# Patient Record
Sex: Male | Born: 1945 | Race: White | Hispanic: No | Marital: Married
Health system: Midwestern US, Academic
[De-identification: ages and names within clinical notes are randomized; demographics above are authoritative.]

---

## 2016-07-30 MED ORDER — ATORVASTATIN 40 MG PO TAB
40 mg | ORAL_TABLET | Freq: Every day | ORAL | 1 refills | Status: DC
Start: 2016-07-30 — End: 2017-02-04

## 2016-08-27 ENCOUNTER — Encounter: Admit: 2016-08-27 | Discharge: 2016-08-27 | Payer: MEDICARE

## 2016-09-12 ENCOUNTER — Ambulatory Visit: Admit: 2016-09-12 | Discharge: 2016-09-12 | Payer: MEDICARE

## 2016-09-12 ENCOUNTER — Encounter: Admit: 2016-09-12 | Discharge: 2016-09-12 | Payer: MEDICARE

## 2016-09-12 DIAGNOSIS — M47816 Spondylosis without myelopathy or radiculopathy, lumbar region: ICD-10-CM

## 2016-09-12 DIAGNOSIS — M5430 Sciatica, unspecified side: ICD-10-CM

## 2016-09-12 DIAGNOSIS — Z9889 Other specified postprocedural states: ICD-10-CM

## 2016-09-12 DIAGNOSIS — M4696 Unspecified inflammatory spondylopathy, lumbar region: Principal | ICD-10-CM

## 2016-09-12 DIAGNOSIS — I4891 Unspecified atrial fibrillation: ICD-10-CM

## 2016-09-12 DIAGNOSIS — F419 Anxiety disorder, unspecified: ICD-10-CM

## 2016-09-12 DIAGNOSIS — E785 Hyperlipidemia, unspecified: ICD-10-CM

## 2016-09-12 DIAGNOSIS — E119 Type 2 diabetes mellitus without complications: ICD-10-CM

## 2016-09-12 DIAGNOSIS — E669 Obesity, unspecified: ICD-10-CM

## 2016-09-12 DIAGNOSIS — R079 Chest pain, unspecified: ICD-10-CM

## 2016-09-12 DIAGNOSIS — I10 Essential (primary) hypertension: ICD-10-CM

## 2016-09-12 DIAGNOSIS — I4892 Unspecified atrial flutter: ICD-10-CM

## 2016-09-12 DIAGNOSIS — F329 Major depressive disorder, single episode, unspecified: ICD-10-CM

## 2016-10-03 ENCOUNTER — Encounter: Admit: 2016-10-03 | Discharge: 2016-10-03 | Payer: MEDICARE

## 2016-10-03 ENCOUNTER — Ambulatory Visit: Admit: 2016-10-03 | Discharge: 2016-10-04 | Payer: MEDICARE

## 2016-10-03 DIAGNOSIS — R079 Chest pain, unspecified: ICD-10-CM

## 2016-10-03 DIAGNOSIS — F419 Anxiety disorder, unspecified: ICD-10-CM

## 2016-10-03 DIAGNOSIS — E785 Hyperlipidemia, unspecified: ICD-10-CM

## 2016-10-03 DIAGNOSIS — F329 Major depressive disorder, single episode, unspecified: ICD-10-CM

## 2016-10-03 DIAGNOSIS — E669 Obesity, unspecified: ICD-10-CM

## 2016-10-03 DIAGNOSIS — E119 Type 2 diabetes mellitus without complications: ICD-10-CM

## 2016-10-03 DIAGNOSIS — I10 Essential (primary) hypertension: ICD-10-CM

## 2016-10-03 DIAGNOSIS — I4891 Unspecified atrial fibrillation: ICD-10-CM

## 2016-10-03 DIAGNOSIS — Z9889 Other specified postprocedural states: ICD-10-CM

## 2016-10-03 DIAGNOSIS — I4892 Unspecified atrial flutter: ICD-10-CM

## 2016-10-03 MED ORDER — LIDOCAINE 5 % TP PTMD
1 | MEDICATED_PATCH | TOPICAL | 1 refills | 30.00000 days | Status: AC
Start: 2016-10-03 — End: 2017-02-13

## 2016-10-04 DIAGNOSIS — M4696 Unspecified inflammatory spondylopathy, lumbar region: Principal | ICD-10-CM

## 2016-11-19 ENCOUNTER — Encounter: Admit: 2016-11-19 | Discharge: 2016-11-19 | Payer: MEDICARE

## 2016-11-19 MED ORDER — HYDROCHLOROTHIAZIDE 25 MG PO TAB
25 mg | ORAL_TABLET | Freq: Every morning | ORAL | 3 refills | 28.00000 days | Status: AC
Start: 2016-11-19 — End: 2018-03-10

## 2016-11-28 ENCOUNTER — Encounter: Admit: 2016-11-28 | Discharge: 2016-11-28 | Payer: MEDICARE

## 2016-11-28 ENCOUNTER — Ambulatory Visit: Admit: 2016-11-28 | Discharge: 2016-11-28 | Payer: MEDICARE

## 2016-11-28 DIAGNOSIS — I4891 Unspecified atrial fibrillation: ICD-10-CM

## 2016-11-28 DIAGNOSIS — E669 Obesity, unspecified: ICD-10-CM

## 2016-11-28 DIAGNOSIS — R079 Chest pain, unspecified: ICD-10-CM

## 2016-11-28 DIAGNOSIS — E785 Hyperlipidemia, unspecified: ICD-10-CM

## 2016-11-28 DIAGNOSIS — I4892 Unspecified atrial flutter: ICD-10-CM

## 2016-11-28 DIAGNOSIS — F419 Anxiety disorder, unspecified: ICD-10-CM

## 2016-11-28 DIAGNOSIS — M25852 Other specified joint disorders, left hip: Principal | ICD-10-CM

## 2016-11-28 DIAGNOSIS — F329 Major depressive disorder, single episode, unspecified: ICD-10-CM

## 2016-11-28 DIAGNOSIS — I10 Essential (primary) hypertension: Secondary | ICD-10-CM

## 2016-11-28 DIAGNOSIS — E119 Type 2 diabetes mellitus without complications: ICD-10-CM

## 2016-11-28 DIAGNOSIS — Z9889 Other specified postprocedural states: ICD-10-CM

## 2016-11-28 MED ORDER — ACETAMINOPHEN-CODEINE 300-30 MG PO TAB
1 | ORAL_TABLET | Freq: Every day | ORAL | 0 refills | 3.00000 days | Status: AC | PRN
Start: 2016-11-28 — End: 2016-12-12

## 2016-12-04 ENCOUNTER — Encounter: Admit: 2016-12-04 | Discharge: 2016-12-04 | Payer: MEDICARE

## 2016-12-04 MED ORDER — ACETAMINOPHEN-CODEINE 300-30 MG PO TAB
1 | ORAL_TABLET | Freq: Every day | ORAL | 0 refills | 3.00000 days | Status: AC | PRN
Start: 2016-12-04 — End: 2016-12-12

## 2016-12-12 ENCOUNTER — Ambulatory Visit: Admit: 2016-12-12 | Discharge: 2016-12-13 | Payer: MEDICARE

## 2016-12-12 ENCOUNTER — Encounter: Admit: 2016-12-12 | Discharge: 2016-12-12 | Payer: MEDICARE

## 2016-12-12 DIAGNOSIS — E119 Type 2 diabetes mellitus without complications: ICD-10-CM

## 2016-12-12 DIAGNOSIS — F419 Anxiety disorder, unspecified: ICD-10-CM

## 2016-12-12 DIAGNOSIS — E669 Obesity, unspecified: ICD-10-CM

## 2016-12-12 DIAGNOSIS — Z9889 Other specified postprocedural states: ICD-10-CM

## 2016-12-12 DIAGNOSIS — I10 Essential (primary) hypertension: ICD-10-CM

## 2016-12-12 DIAGNOSIS — I4891 Unspecified atrial fibrillation: ICD-10-CM

## 2016-12-12 DIAGNOSIS — I4892 Unspecified atrial flutter: ICD-10-CM

## 2016-12-12 DIAGNOSIS — F329 Major depressive disorder, single episode, unspecified: ICD-10-CM

## 2016-12-12 DIAGNOSIS — R079 Chest pain, unspecified: ICD-10-CM

## 2016-12-12 DIAGNOSIS — E785 Hyperlipidemia, unspecified: ICD-10-CM

## 2016-12-12 MED ORDER — ACETAMINOPHEN-CODEINE 300-30 MG PO TAB
1 | ORAL_TABLET | Freq: Every day | ORAL | 0 refills | 3.00000 days | Status: AC | PRN
Start: 2016-12-12 — End: 2017-02-07

## 2016-12-13 DIAGNOSIS — M1612 Unilateral primary osteoarthritis, left hip: Principal | ICD-10-CM

## 2016-12-19 ENCOUNTER — Encounter: Admit: 2016-12-19 | Discharge: 2016-12-19 | Payer: MEDICARE

## 2016-12-19 MED ORDER — LOSARTAN 100 MG PO TAB
100 mg | ORAL_TABLET | Freq: Every day | ORAL | 3 refills | 30.00000 days | Status: AC
Start: 2016-12-19 — End: 2018-01-15

## 2016-12-21 ENCOUNTER — Ambulatory Visit: Admit: 2016-12-21 | Discharge: 2016-12-22 | Payer: MEDICARE

## 2016-12-21 ENCOUNTER — Encounter: Admit: 2016-12-21 | Discharge: 2016-12-21 | Payer: MEDICARE

## 2016-12-21 DIAGNOSIS — F419 Anxiety disorder, unspecified: ICD-10-CM

## 2016-12-21 DIAGNOSIS — R079 Chest pain, unspecified: ICD-10-CM

## 2016-12-21 DIAGNOSIS — I10 Essential (primary) hypertension: Secondary | ICD-10-CM

## 2016-12-21 DIAGNOSIS — E669 Obesity, unspecified: ICD-10-CM

## 2016-12-21 DIAGNOSIS — I4891 Unspecified atrial fibrillation: ICD-10-CM

## 2016-12-21 DIAGNOSIS — E785 Hyperlipidemia, unspecified: ICD-10-CM

## 2016-12-21 DIAGNOSIS — Z9889 Other specified postprocedural states: ICD-10-CM

## 2016-12-21 DIAGNOSIS — E119 Type 2 diabetes mellitus without complications: ICD-10-CM

## 2016-12-21 DIAGNOSIS — F329 Major depressive disorder, single episode, unspecified: ICD-10-CM

## 2016-12-21 DIAGNOSIS — I4892 Unspecified atrial flutter: ICD-10-CM

## 2016-12-21 NOTE — Progress Notes
Orthopaedic Surgery History and Physical - Dan Fogo, MD    Referring Provider: Remonia Richter    Date of Visit: 12/21/16  _______________________________________________    DIAGNOSIS:   Left hip degenerative joint disease    PLAN:   - Left total hip arthroplasty  - Anticoagulation: ASA  - Clearances needed: PCP, dental, cardiology, nephrology      ASSESSMENT:   The patient has end-stage degenerative joint disease which has not responded adequately to conservative management as documented below.  At this point a total hip arthroplasty would be the most reliable treatment for pain relief and improvement of function.  A discussion was had regarding the benefits and risks of THA.  These include but are not limited to infection, failure of hardware, damage to nerves and vessels, wound complications, need for revision, blood loss, loss of function, and continued hip pain.  We also discussed the fact that he is at a elevated risk for infection given the fact that he has stage III liver disease and diabetes.  We will need to know what his A1c is prior to proceeding.  Additionally, he will need consultation with his nephrologist, cardiologist, primary care physician, dentist prior to proceeding with surgical intervention.  The patient expressed understanding of the risks and would like to move forward with the procedure.  All questions were answered.    ________________________________________________    CHIEF COMPLAINT: Left hip pain     HISTORY OF PRESENT ILLNESS: Dan Mccarty is a 71 y.o. male who presents with left hip pain. The pain has been present for 2-3 years and getting worse.  It is located in the buttock and groin and described as severe.  Also with pain down the leg to the foot.  Pain in the groin is the worst type of pain.  It is worst with standing up, stairs and activity and better with nothing.  Failed conservative treatment measures include tylenol, Tylenol #3, not allowed to take NSAIDs (kidney dz), PT, activity modification.  The patient is unable to perform regular tasks or walk any real distance without having to stop because of pain.  Recently lost almost 100lbs with diet and exercise.    He has a history of Afib s/p ablation.  He is taking ASA 325, but no other blood thinner.  He has NIDDM.  No known A1C.  Stage 3 kidney dz.    PHYSICAL EXAM:  Vitals:    12/21/16 0931   BP: 131/69   Pulse: 70   Resp: 14   Temp: 36.3 ???C (97.3 ???F)   SpO2: 100%       Musculoskeletal: Exam of Left Lower Extremity   - Hip ROM 0-110, obligate ER, pain with IR   - Stinchfield positive   - Strength:  Normal strength in hip flexion, knee extension, ankle dorsiflexion   - Palpation: No tenderness to palpation over the greater trochanter   - Dorsalis pedis 2+ pulse, foot warm and well perfused.    Constitutional: In no distress  Eyes: No scleral icterus, conjugate gaze intact  Respiratory: Non labored respirations, no audible wheezing, no coughing  Cardiovascular: Normal rate per peripheral pulse, normal rhythm per peripheral pulse  Lymphatic: No peripheral edema.  No asymmetric edema.  Skin: No rashes. No ulcers.  Neurological: Normal sensation to light touch.  No weakness on strength testing.  Psychiatric: Mood is appropriate.  Judgement and insight seems appropriate.       REVIEW OF SYSTEMS:   12 systems (General, HENT,  Eyes, Respiratory, CV, GI, GU, MS, Skin, Neurological, Hematologic, and Psychiatric) were reviewed and documented per the patient intake form completed during today's visit. The review was negative for reported symptoms except those detailed in HPI as well as the following: None    PAST MEDICAL HISTORY:   Past Medical History:   Diagnosis Date   ??? Anxiety 09/12/2010   ??? Atrial fibrillation (HCC) 09/26/2010   ??? Atrial flutter (HCC) 02/22/2011   ??? Chest pain    ??? Chest pain 09/20/2006    09/19/06 Heart Cath:  1. Normal left ventricular size, systolic function and hemodynamics.  2. Anterolateral hypokinesis.  3. 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 Depression 09/12/2010   ??? Diabetes (HCC) 09/12/2010   ??? Diabetes mellitus type II 09/12/2010   ??? HTN (hypertension)    ??? Hyperlipidemia 09/20/2006   ??? Hypertension 09/20/2006   ??? Obesity 09/12/2010   ??? S/P ablation of atrial flutter 01/29/2013    2012 by Dr. Wallene Huh       PAST SURGICAL HISTORY:   Past Surgical History:   Procedure Laterality Date   ??? CARDIAC CATHERIZATION     ??? CHOLECYSTECTOMY     ??? KNEE ARTHROSCOPY         FAMILY HISTORY:   Family History   Problem Relation Age of Onset   ??? Hypertension Mother    ??? Diabetes Mother        SOCIAL HISTORY:  reports that he quit smoking about 46 years ago. He quit after 4.00 years of use. He has never used smokeless tobacco. He reports that he does not drink alcohol or use drugs.    MEDICATIONS:   Current Outpatient Prescriptions:   ???  acetaminophen (TYLENOL) 500 mg tablet, Take 1,000 mg by mouth every 6 hours as needed for Pain. Max of 4,000 mg of acetaminophen in 24 hours., Disp: , Rfl:   ???  acetaminophen/codeine (TYLENOL #3) 300/30 mg tablet, Take one tablet by mouth daily as needed for Pain. Max of 4,000 mg of acetaminophen in 24 hours., Disp: 21 tablet, Rfl: 0  ???  aspirin 325 mg tablet, Take 325 mg by mouth daily.  , Disp: , Rfl:   ???  atorvastatin (LIPITOR) 40 mg tablet, TAKE 1 TABLET BY MOUTH  DAILY, Disp: 90 tablet, Rfl: 1  ???  carvedilol (COREG) 3.125 mg tablet, Take 1 tablet by mouth twice daily with meals. Take with food., Disp: 180 tablet, Rfl: 3  ???  carvedilol (COREG) 6.25 mg tablet, Take one tablet by mouth twice daily along with 3.125 tablet., Disp: 180 tablet, Rfl: 2  ???  citalopram (CELEXA) 20 mg tablet, Take 20 mg by mouth Daily. , Disp: , Rfl:   ???  hydroCHLOROthiazide (HYDRODIURIL) 25 mg tablet, TAKE 1 TABLET BY MOUTH  EVERY MORNING, Disp: 90 tablet, Rfl: 3 ???  JANUVIA 100 mg tab tablet, Take 1 Tab by mouth daily., Disp: , Rfl:   ???  lidocaine (LIDODERM) 5 % topical patch, Apply 1 patch topically to affected area every 24 hours. Apply patch for 12 hours, then remove for 12 hours before repeating., Disp: 30 patch, Rfl: 1  ???  losartan(+) (COZAAR) 100 mg tablet, TAKE 1 TABLET BY MOUTH  DAILY, Disp: 90 tablet, Rfl: 3  ???  metFORMIN (GLUCOPHAGE) 1,000 mg tablet, Take 1 Tab by mouth twice daily., Disp: , Rfl:   ???  nitroglycerin (NITROSTAT) 0.4 mg tablet, 1 Tab as Needed  for Chest Pain., Disp: 25 Tab, Rfl: 1  ???  OMEGA-3S-DHA-EPA-FISH OIL PO, Take 1,200 mg by mouth three times daily. 2 caps in the am, 1 cap in the pm , Disp: , Rfl:   ???  potassium chloride SR (KLOR-CON M20) 20 mEq tablet, Take 20 mEq by mouth daily.  , Disp: , Rfl:   ???  vitamins, multiple Cap, Take 1 Cap by mouth daily.  , Disp: , Rfl:     ALLERGIES:   Allergies   Allergen Reactions   ??? Adhesive Tape (Rosins) RASH       IMAGING: Imaging reviewed by physician   XR pelvis and left hip reveals narrowed joint space, periarticular osteophytes, and subchondral sclerosis consistent with advanced degenerative joint disease of the left hip.

## 2016-12-22 DIAGNOSIS — M1612 Unilateral primary osteoarthritis, left hip: Principal | ICD-10-CM

## 2016-12-28 LAB — CBC
Lab: 4.1 — ABNORMAL LOW (ref 4.70–6.10)
Lab: 8.8

## 2016-12-28 LAB — COMPREHENSIVE METABOLIC PANEL
Lab: 0.9
Lab: 138 — ABNORMAL LOW (ref 14.0–18.0)
Lab: 16 — ABNORMAL HIGH (ref 0–14)
Lab: 22
Lab: 23
Lab: 3.8
Lab: 7.7
Lab: 71

## 2016-12-28 LAB — LIPID PROFILE
Lab: 121 — ABNORMAL LOW (ref 150–200)
Lab: 31
Lab: 35 — ABNORMAL LOW (ref 23–31)
Lab: 4 — ABNORMAL HIGH (ref 8.8–10.0)
Lab: 63 — ABNORMAL HIGH (ref 8.4–25.7)

## 2016-12-28 LAB — THYROID STIMULATING HORMONE-TSH: Lab: 1.6

## 2017-01-07 ENCOUNTER — Encounter: Admit: 2017-01-07 | Discharge: 2017-01-07 | Payer: MEDICARE

## 2017-01-10 ENCOUNTER — Encounter: Admit: 2017-01-10 | Discharge: 2017-01-10 | Payer: MEDICARE

## 2017-01-11 ENCOUNTER — Encounter: Admit: 2017-01-11 | Discharge: 2017-01-11 | Payer: MEDICARE

## 2017-01-14 ENCOUNTER — Encounter: Admit: 2017-01-14 | Discharge: 2017-01-14 | Payer: MEDICARE

## 2017-01-14 NOTE — Telephone Encounter
Received call from patient stating he is in need of a refill on Lidocaine patch and Tylenol #3. He is having left hip surgery on November 29th, 2018. Note from office visit of 12-12-2016 by Dr. Salley Scarlet states patient prescribed Tylenol No. 3, 20 tablets. Informed Dan Mccarty I could refill Lidocaine patch but will have to have physician to [place] Tylenol #3 tabs. Will discuss with Dr. Lawernce Ion of Dr. Gelene Mink who are covering while Dr. Salley Scarlet is gone.

## 2017-01-15 NOTE — Telephone Encounter
Discussed with [name] covering providers for Dr. Salley Scarlet will not order Tylenol #3. Suggested him contact his PCP for this medication. Patient verbalizes comprehension.

## 2017-01-16 ENCOUNTER — Encounter: Admit: 2017-01-16 | Discharge: 2017-01-16 | Payer: MEDICARE

## 2017-01-16 MED ORDER — CARVEDILOL 3.125 MG PO TAB
3.125 mg | ORAL_TABLET | Freq: Two times a day (BID) | ORAL | 3 refills | 90.00000 days | Status: AC
Start: 2017-01-16 — End: 2017-02-13

## 2017-01-16 MED ORDER — CARVEDILOL 6.25 MG PO TAB
ORAL_TABLET | Freq: Two times a day (BID) | ORAL | 3 refills | 90.00000 days | Status: AC
Start: 2017-01-16 — End: 2017-02-18

## 2017-01-31 ENCOUNTER — Encounter: Admit: 2017-01-31 | Discharge: 2017-01-31 | Payer: MEDICARE

## 2017-01-31 DIAGNOSIS — M79605 Pain in left leg: ICD-10-CM

## 2017-01-31 DIAGNOSIS — M1612 Unilateral primary osteoarthritis, left hip: Principal | ICD-10-CM

## 2017-01-31 DIAGNOSIS — E1010 Type 1 diabetes mellitus with ketoacidosis without coma: ICD-10-CM

## 2017-01-31 MED ORDER — CEFAZOLIN INJ 1GM IVP
2 g | Freq: Once | INTRAVENOUS | 0 refills | Status: CN
Start: 2017-01-31 — End: ?

## 2017-02-04 ENCOUNTER — Encounter: Admit: 2017-02-04 | Discharge: 2017-02-04 | Payer: MEDICARE

## 2017-02-04 MED ORDER — ATORVASTATIN 40 MG PO TAB
40 mg | ORAL_TABLET | Freq: Every day | ORAL | 1 refills | Status: AC
Start: 2017-02-04 — End: 2017-12-27

## 2017-02-07 ENCOUNTER — Ambulatory Visit: Admit: 2017-02-07 | Discharge: 2017-02-08 | Payer: MEDICARE

## 2017-02-07 ENCOUNTER — Encounter: Admit: 2017-02-07 | Discharge: 2017-02-07 | Payer: MEDICARE

## 2017-02-07 DIAGNOSIS — E119 Type 2 diabetes mellitus without complications: ICD-10-CM

## 2017-02-07 DIAGNOSIS — I483 Typical atrial flutter: ICD-10-CM

## 2017-02-07 DIAGNOSIS — F329 Major depressive disorder, single episode, unspecified: ICD-10-CM

## 2017-02-07 DIAGNOSIS — E785 Hyperlipidemia, unspecified: ICD-10-CM

## 2017-02-07 DIAGNOSIS — I10 Essential (primary) hypertension: Secondary | ICD-10-CM

## 2017-02-07 DIAGNOSIS — E669 Obesity, unspecified: ICD-10-CM

## 2017-02-07 DIAGNOSIS — I4892 Unspecified atrial flutter: ICD-10-CM

## 2017-02-07 DIAGNOSIS — R079 Chest pain, unspecified: ICD-10-CM

## 2017-02-07 DIAGNOSIS — F419 Anxiety disorder, unspecified: ICD-10-CM

## 2017-02-07 DIAGNOSIS — Z01818 Encounter for other preprocedural examination: ICD-10-CM

## 2017-02-07 DIAGNOSIS — Z9889 Other specified postprocedural states: ICD-10-CM

## 2017-02-07 DIAGNOSIS — I4891 Unspecified atrial fibrillation: ICD-10-CM

## 2017-02-08 ENCOUNTER — Ambulatory Visit: Admit: 2017-02-08 | Discharge: 2017-02-08 | Payer: MEDICARE

## 2017-02-08 DIAGNOSIS — I483 Typical atrial flutter: ICD-10-CM

## 2017-02-08 DIAGNOSIS — I10 Essential (primary) hypertension: ICD-10-CM

## 2017-02-08 DIAGNOSIS — Z01818 Encounter for other preprocedural examination: ICD-10-CM

## 2017-02-08 DIAGNOSIS — E785 Hyperlipidemia, unspecified: Principal | ICD-10-CM

## 2017-02-08 MED ORDER — PERFLUTREN LIPID MICROSPHERES 1.1 MG/ML IV SUSP
1-20 mL | Freq: Once | INTRAVENOUS | 0 refills | Status: CP
Start: 2017-02-08 — End: ?
  Administered 2017-02-08: 19:00:00 2 mL via INTRAVENOUS

## 2017-02-11 ENCOUNTER — Ambulatory Visit: Admit: 2017-02-11 | Discharge: 2017-02-12 | Payer: MEDICARE

## 2017-02-11 DIAGNOSIS — Z01818 Encounter for other preprocedural examination: ICD-10-CM

## 2017-02-11 DIAGNOSIS — E785 Hyperlipidemia, unspecified: Principal | ICD-10-CM

## 2017-02-11 DIAGNOSIS — I10 Essential (primary) hypertension: ICD-10-CM

## 2017-02-11 DIAGNOSIS — I483 Typical atrial flutter: ICD-10-CM

## 2017-02-11 MED ORDER — REGADENOSON 0.4 MG/5 ML IV SYRG
.4 mg | Freq: Once | INTRAVENOUS | 0 refills | Status: CP
Start: 2017-02-11 — End: ?

## 2017-02-11 MED ORDER — ALBUTEROL SULFATE 90 MCG/ACTUATION IN HFAA
2 | RESPIRATORY_TRACT | 0 refills | Status: DC | PRN
Start: 2017-02-11 — End: 2017-02-16

## 2017-02-11 MED ORDER — SODIUM CHLORIDE 0.9 % IV SOLP
250 mL | INTRAVENOUS | 0 refills | Status: AC | PRN
Start: 2017-02-11 — End: ?

## 2017-02-11 MED ORDER — NITROGLYCERIN 0.4 MG SL SUBL
.4 mg | SUBLINGUAL | 0 refills | Status: AC | PRN
Start: 2017-02-11 — End: ?

## 2017-02-11 MED ORDER — AMINOPHYLLINE 500 MG/20 ML IV SOLN
50 mg | INTRAVENOUS | 0 refills | Status: AC | PRN
Start: 2017-02-11 — End: ?

## 2017-02-11 NOTE — Progress Notes
Peripheral IV Insertion Note:  Patient Side: left  Line Orientation:Hand  IV Catheter Size: 22G  Number of Attempts:1.  IV capped and flushed with Normal Saline.  IV site without redness, swelling, or pain.  New dressing placed.    After procedure IV cannula removed intact and hemostasis achieved.

## 2017-02-12 ENCOUNTER — Encounter: Admit: 2017-02-12 | Discharge: 2017-02-12 | Payer: MEDICARE

## 2017-02-13 ENCOUNTER — Encounter: Admit: 2017-02-13 | Discharge: 2017-02-13 | Payer: MEDICARE

## 2017-02-13 ENCOUNTER — Ambulatory Visit: Admit: 2017-02-13 | Discharge: 2017-02-14 | Payer: MEDICARE

## 2017-02-13 ENCOUNTER — Inpatient Hospital Stay: Admit: 2017-02-13 | Discharge: 2017-02-13 | Payer: MEDICARE

## 2017-02-13 DIAGNOSIS — E785 Hyperlipidemia, unspecified: ICD-10-CM

## 2017-02-13 DIAGNOSIS — I10 Essential (primary) hypertension: ICD-10-CM

## 2017-02-13 DIAGNOSIS — E669 Obesity, unspecified: ICD-10-CM

## 2017-02-13 DIAGNOSIS — F419 Anxiety disorder, unspecified: ICD-10-CM

## 2017-02-13 DIAGNOSIS — R079 Chest pain, unspecified: ICD-10-CM

## 2017-02-13 DIAGNOSIS — I4892 Unspecified atrial flutter: ICD-10-CM

## 2017-02-13 DIAGNOSIS — E119 Type 2 diabetes mellitus without complications: ICD-10-CM

## 2017-02-13 DIAGNOSIS — M1990 Unspecified osteoarthritis, unspecified site: ICD-10-CM

## 2017-02-13 DIAGNOSIS — Z8669 Personal history of other diseases of the nervous system and sense organs: ICD-10-CM

## 2017-02-13 DIAGNOSIS — I4891 Unspecified atrial fibrillation: ICD-10-CM

## 2017-02-13 DIAGNOSIS — Z9889 Other specified postprocedural states: ICD-10-CM

## 2017-02-13 DIAGNOSIS — N189 Chronic kidney disease, unspecified: ICD-10-CM

## 2017-02-13 DIAGNOSIS — F329 Major depressive disorder, single episode, unspecified: ICD-10-CM

## 2017-02-13 LAB — COMPREHENSIVE METABOLIC PANEL
Lab: 0.6 mg/dL (ref 0.3–1.2)
Lab: 10 K/UL (ref 3–12)
Lab: 137 MMOL/L — ABNORMAL LOW (ref 137–147)
Lab: 19 U/L (ref 7–56)
Lab: 22 U/L (ref 7–40)
Lab: 23 MMOL/L (ref 21–30)
Lab: 4.4 g/dL (ref >60)
Lab: 59 mL/min — ABNORMAL LOW (ref >60)
Lab: 63 U/L (ref >60)
Lab: 7.3 g/dL (ref 6.0–8.0)
Lab: 9.7 mg/dL (ref 8.5–10.6)
Lab: >60 mL/min (ref >60)

## 2017-02-13 LAB — PTT (APTT): Lab: 25 s — ABNORMAL HIGH (ref 20.0–36.0)

## 2017-02-13 LAB — CBC AND DIFF
Lab: 0 10*3/uL (ref 0–0.20)
Lab: 0.5 10*3/uL — ABNORMAL HIGH (ref 0–0.45)
Lab: 9.4 10*3/uL — ABNORMAL HIGH (ref 4.5–11.0)

## 2017-02-13 LAB — PROTIME INR (PT): Lab: 1.1 MMOL/L — ABNORMAL LOW (ref 0.8–1.2)

## 2017-02-13 NOTE — Telephone Encounter
-----   Message from Hester MatesSteven B Gollub, MD sent at 02/12/2017  2:01 PM CST -----  Franchot MimesSteve and Lisa: [facility] has been completed and appears as an addendum to his clinic visit from 02/07/2017.  Please send to his surgical team and anesthesiologist.  Please inform Mr. Hanway of the favorable results of his recent stress test.  Thanks. SBG  ----- Message -----  From: Laurence Alyosamond, Thomas L, MD  Sent: 02/12/2017   1:42 PM  To: Hester MatesSteven B Gollub, MD

## 2017-02-13 NOTE — Telephone Encounter
Left vm with office of Dr. Andreas NewportEplee. Requested A1C please be faxed to me. Left direct ext

## 2017-02-13 NOTE — Pre-Anesthesia Patient Instructions
GENERAL INFORMATION    Before you come to the hospital  ??? Make arrangements for a responsible adult to drive you home and stay with you for 24 hours following surgery.  ??? Bath/Shower Instructions  ??? Take a bath or shower using the special soap given to you in PAC. Use half the bottle the night before, and the other half the morning of your procedure. Use clean towels with each bath or shower.  ??? Put on clean clothes after bath or shower.  Avoid using lotion and oils.  ??? Sleep on clean sheets if bath or shower is done the night before procedure.  ??? Leave money, credit cards, jewelry, and any other valuables at home. [facility] is not responsible for the loss or breakage of personal items.  ??? Remove nail polish, makeup and all jewelry (including piercings) before coming to the hospital.  ??? The morning of your procedure:  ??? brush your teeth and tongue  ??? do not smoke  ??? do not shave the area where you will have surgery    What to bring to the hospital  ??? ID/ Insurance Card  ??? Medical Device card  ??? Official documents for legal guardianship   ??? Copy of your Living Will, Advanced Directives, and/or Durable Power of Attorney   ??? Small bag with a few personal belongings  ??? CPAP/BiPAP machine (including all supplies)  ??? Walker,cane, or motorized scooter  ??? Cases for glasses/hearing aids/contact lens (bring solutions for contacts)  ??? Dress in clean, loose, comfortable clothing     Eating or drinking before surgery  ??? Do not eat or drink anything after 11:00 p.m. the day before your procedure (including gum, mints, candy, or chewing tobacco) OR follow the specific instructions you were given by your Surgeon.  ??? You may have WATER ONLY up to 2 hours before arriving at the hospital.     Other instructions  Notify your surgeon if:  ??? you become ill with a cough, fever, sore throat, nausea, vomiting or flu-like symptoms  ??? you have any open wounds/sores that are red, painful, draining, or are new since you last saw  the doctor  ??? you need to cancel your procedure  ??? You will receive a call with your surgery arrival time from between 2:30pm and 4:30pm the last business day before your procedure.  If you do not receive a call, please call [phone] before 4:30pm or [phone] after 4:30pm.    Notify us at [facility]: [phone]  ??? if you need to cancel your procedure  ??? if you are going to be late    Arrival at the hospital    [facility]  [address]  [place], [place] [address]    ??? Park in the [employer], located directly across from the main entrance to the hospital.  ??? Valet parking is available  from 7 AM to 4 PM Monday through Friday.  ??? Enter through the ground floor main hospital entrance and check in at the Information Desk in the lobby.  ??? They will validate your parking ticket and direct you to the next location.

## 2017-02-13 NOTE — Telephone Encounter
Results and recommendations called to patient lmom requested call back if questions    I have asked him to decrease his carvedilol from 9.375 mg twice daily to 6.25 mg twice daily. I have asked him to monitor his blood pressure daily and report his blood pressures on 02/12/2017. If his blood pressure remains low, he can then decrease his carvedilol to 3.125 mg twice daily. The patient has been instructed to report his blood pressures on a weekly basis.

## 2017-02-14 DIAGNOSIS — M1612 Unilateral primary osteoarthritis, left hip: Principal | ICD-10-CM

## 2017-02-14 DIAGNOSIS — M79605 Pain in left leg: ICD-10-CM

## 2017-02-15 LAB — MRSA SCREEN

## 2017-02-18 ENCOUNTER — Encounter: Admit: 2017-02-18 | Discharge: 2017-02-18 | Payer: MEDICARE

## 2017-02-18 NOTE — Telephone Encounter
Patient reports bp as still running low.  Low 100's to 90's.  Coreg reduced as directed

## 2017-02-20 ENCOUNTER — Encounter: Admit: 2017-02-20 | Discharge: 2017-02-20 | Payer: MEDICARE

## 2017-02-20 DIAGNOSIS — 1 ERRONEOUS ENCOUNTER--DISREGARD: ICD-10-CM

## 2017-02-20 DIAGNOSIS — M79609 Pain in unspecified limb: Principal | ICD-10-CM

## 2017-02-20 DIAGNOSIS — M1612 Unilateral primary osteoarthritis, left hip: ICD-10-CM

## 2017-02-20 NOTE — Progress Notes
This encounter was created in error. Please disregard.

## 2017-02-21 ENCOUNTER — Inpatient Hospital Stay: Admit: 2017-02-21 | Discharge: 2017-02-21 | Payer: MEDICARE

## 2017-02-21 ENCOUNTER — Encounter: Admit: 2017-02-21 | Discharge: 2017-02-21 | Payer: MEDICARE

## 2017-02-21 ENCOUNTER — Inpatient Hospital Stay: Admit: 2017-02-21 | Discharge: 2017-02-21 | Disposition: A | Discharge status: Home / Self Care | Payer: MEDICARE

## 2017-02-21 DIAGNOSIS — I10 Essential (primary) hypertension: Secondary | ICD-10-CM

## 2017-02-21 DIAGNOSIS — E669 Obesity, unspecified: ICD-10-CM

## 2017-02-21 DIAGNOSIS — I4892 Unspecified atrial flutter: ICD-10-CM

## 2017-02-21 DIAGNOSIS — Z5309 Procedure and treatment not carried out because of other contraindication: ICD-10-CM

## 2017-02-21 DIAGNOSIS — N183 Chronic kidney disease, stage 3 (moderate): ICD-10-CM

## 2017-02-21 DIAGNOSIS — Z87891 Personal history of nicotine dependence: ICD-10-CM

## 2017-02-21 DIAGNOSIS — M1612 Unilateral primary osteoarthritis, left hip: Principal | ICD-10-CM

## 2017-02-21 DIAGNOSIS — Z6832 Body mass index (BMI) 32.0-32.9, adult: ICD-10-CM

## 2017-02-21 DIAGNOSIS — E1122 Type 2 diabetes mellitus with diabetic chronic kidney disease: ICD-10-CM

## 2017-02-21 DIAGNOSIS — N189 Chronic kidney disease, unspecified: ICD-10-CM

## 2017-02-21 DIAGNOSIS — R079 Chest pain, unspecified: ICD-10-CM

## 2017-02-21 DIAGNOSIS — I4891 Unspecified atrial fibrillation: ICD-10-CM

## 2017-02-21 DIAGNOSIS — E785 Hyperlipidemia, unspecified: ICD-10-CM

## 2017-02-21 DIAGNOSIS — Z8669 Personal history of other diseases of the nervous system and sense organs: ICD-10-CM

## 2017-02-21 DIAGNOSIS — F419 Anxiety disorder, unspecified: ICD-10-CM

## 2017-02-21 DIAGNOSIS — Z9889 Other specified postprocedural states: ICD-10-CM

## 2017-02-21 DIAGNOSIS — F329 Major depressive disorder, single episode, unspecified: ICD-10-CM

## 2017-02-21 DIAGNOSIS — E119 Type 2 diabetes mellitus without complications: ICD-10-CM

## 2017-02-21 DIAGNOSIS — M1990 Unspecified osteoarthritis, unspecified site: ICD-10-CM

## 2017-02-21 LAB — POC GLUCOSE: Lab: 116 mg/dL — ABNORMAL HIGH (ref 70–100)

## 2017-02-21 MED ORDER — OXYCODONE 5 MG PO TAB
5-10 mg | Freq: Once | ORAL | 0 refills | Status: CN | PRN
Start: 2017-02-21 — End: ?

## 2017-02-21 MED ORDER — CEFAZOLIN INJ 1GM IVP
2 g | Freq: Once | INTRAVENOUS | 0 refills | Status: AC
Start: 2017-02-21 — End: ?

## 2017-02-21 MED ORDER — LACTATED RINGERS IV SOLP
INTRAVENOUS | 0 refills | Status: CN
Start: 2017-02-21 — End: ?

## 2017-02-21 MED ORDER — GABAPENTIN 300 MG PO CAP
300 mg | Freq: Once | ORAL | 0 refills | Status: CP
Start: 2017-02-21 — End: ?
  Administered 2017-02-21: 13:00:00 300 mg via ORAL

## 2017-02-21 MED ORDER — HYDROMORPHONE (PF) 2 MG/ML IJ SYRG
.5-1 mg | INTRAVENOUS | 0 refills | Status: CN | PRN
Start: 2017-02-21 — End: ?

## 2017-02-21 MED ORDER — FENTANYL CITRATE (PF) 50 MCG/ML IJ SOLN
25 ug | INTRAVENOUS | 0 refills | Status: CN | PRN
Start: 2017-02-21 — End: ?

## 2017-02-21 MED ORDER — OXYCODONE SR 10 MG PO 12HR TABLET
10 mg | Freq: Once | ORAL | 0 refills | Status: CP
Start: 2017-02-21 — End: ?
  Administered 2017-02-21: 13:00:00 10 mg via ORAL

## 2017-02-21 MED ORDER — SODIUM CHLORIDE 0.9 % IV SOLP
1000 mL | INTRAVENOUS | 0 refills | Status: AC
Start: 2017-02-21 — End: ?

## 2017-02-21 MED ORDER — LIDOCAINE (PF) 10 MG/ML (1 %) IJ SOLN
.1-2 mL | INTRAMUSCULAR | 0 refills | Status: DC | PRN
Start: 2017-02-21 — End: 2017-02-27

## 2017-02-21 MED ORDER — ONDANSETRON HCL (PF) 4 MG/2 ML IJ SOLN
4 mg | Freq: Once | INTRAVENOUS | 0 refills | Status: CN | PRN
Start: 2017-02-21 — End: ?

## 2017-02-21 MED ORDER — ACETAMINOPHEN 325 MG PO TAB
650 mg | Freq: Once | ORAL | 0 refills | Status: CP
Start: 2017-02-21 — End: ?
  Administered 2017-02-21: 13:00:00 650 mg via ORAL

## 2017-02-21 MED ORDER — FENTANYL CITRATE (PF) 50 MCG/ML IJ SOLN
50 ug | INTRAVENOUS | 0 refills | Status: CN | PRN
Start: 2017-02-21 — End: ?

## 2017-02-21 MED ADMIN — SODIUM CHLORIDE 0.9 % IV SOLP [27838]: 1000 mL | INTRAVENOUS | @ 13:00:00 | Stop: 2017-02-21 | NDC 00338004904

## 2017-02-21 NOTE — H&P (View-Only)
[facility] History & Physical Note      Admission Date: 02/21/2017                                                  Chief Complaint:  Left hip pain    Assessment/Plan    71 y.o. male with Left hip pain/OA    -Patient did not stop Aspirin preop and will be at unacceptable bleeding risk, thus we will post-pone his surgery.     Patient  discussed with Dr. Opal Sidles who directs plan of care.  _____________________________________________________________________    History of Present Illness: Dan Mccarty is a 71 y.o. male with L hip pain.  Pt presents with continued complaints of L hip pain which has failed non-op measures including NSAIDs, activity modification, injections, and PT.  The risks/benefits of surgery were discussed along with alternatives and she would like to proceed with operative intervention.     Past Medical History:   Diagnosis Date   ??? Anxiety 09/12/2010   ??? Arthritis     lower back   ??? Atrial fibrillation (HCC) 09/26/2010    s/p atrial ablation surgery 2012   ??? Atrial flutter (HCC) 02/22/2011    s/p atrial ablation surgery 2012   ??? Chest pain    ??? Chest pain 09/20/2006    09/19/06 Heart Cath:  1. Normal left ventricular size, systolic function and hemodynamics.  2. Anterolateral hypokinesis.  3. 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 Chronic kidney disease     stage 3   ??? Depression 09/12/2010   ??? Diabetes (HCC) 09/12/2010   ??? Diabetes mellitus type II 09/12/2010   ??? HTN (hypertension)    ??? Hx of sleep apnea     does not use CPAP after 70 lb weight loss and sinus issues   ??? Hyperlipidemia 09/20/2006   ??? Hypertension 09/20/2006   ??? Obesity 09/12/2010   ??? S/P ablation of atrial flutter 01/29/2013    2012 by Dr. Wallene Huh       Past Surgical History:   Procedure Laterality Date   ??? ATRIAL ABLATION SURGERY  2012   ??? CARDIAC CATHERIZATION     ??? CHOLECYSTECTOMY     ??? HX TONSILLECTOMY     ??? KNEE ARTHROSCOPY Left        Social History   Substance Use Topics ??? Smoking status: Former Smoker     Years: 4.00     Quit date: 02/28/1970   ??? Smokeless tobacco: Never Used   ??? Alcohol use No       Family History   Problem Relation Age of Onset   ??? Hypertension Mother    ??? Diabetes Mother    ??? Stroke Mother       Family history reviewed with patient and is non-contributory.    Allergies:  Adhesive tape (rosins) and Nsaids (non-steroidal anti-inflammatory drug)    Current Facility-Administered Medications   Medication   ??? ceFAZolin (ANCEF) IVP 2 g   ??? lidocaine PF 1% (10 mg/mL) injection 0.1-2 mL   ??? sodium chloride 0.9 %   infusion        Review of Systems:  10 point review of systems completed and negative unless otherwise noted in HPI    Vital Signs:  Last Filed in  24 hours   BP: 131/82 (11/29 0646)  Temp: 36.5 ???C (97.7 ???F) (11/29 6295)  Pulse: 66 (11/29 0646)  Respirations: 14 PER MINUTE (11/29 0646)  SpO2: 100 % (11/29 0646)  O2 Delivery: None (Room Air) (11/29 0646)  SpO2 Pulse: 66 (11/29 0646)  Height: 182.9 cm (72) (11/29 2841)     Physical Exam:    General: Alert, NAD  ENT: Oropharynx/Nasopharynx clear  Respiratory: Unlabored respirations  Cardio: RRR  GI: soft, NT, ND  Skin: Normal turgor  Musculoskeletal: Musculoskeletal: Exam of Left Lower Extremity               - Hip ROM 0-110, obligate ER, pain with IR               - Stinchfield positive               - Strength:  Normal strength in hip flexion, knee extension, ankle dorsiflexion               - Palpation: No tenderness to palpation over the greater trochanter               - Dorsalis pedis 2+ pulse, foot warm and well perfused.  Neurologic: Grossly intact, moving all extremities spontaneously    Lab/Radiology/Other Diagnostic Tests:    CBC w/Diff   Lab Results   Component Value Date/Time    WBC 9.4 02/13/2017 01:37 PM    HGB 12.1 (L) 02/13/2017 01:37 PM    HCT 36.4 (L) 02/13/2017 01:37 PM    PLTCT 310 02/13/2017 01:37 PM           Basic Metabolic Profile   Lab Results   Component Value Date/Time NA 137 02/13/2017 01:37 PM    K 3.6 02/13/2017 01:37 PM    CL 104 02/13/2017 01:37 PM    CO2 23 02/13/2017 01:37 PM    GAP 10 02/13/2017 01:37 PM    BUN 30 (H) 02/13/2017 01:37 PM    CR 1.22 02/13/2017 01:37 PM    GLU 142 (H) 02/13/2017 01:37 PM    GLU DUPLICATE ORDER 09/12/2010 06:15 PM        Coagulation Studies   Lab Results   Component Value Date/Time    PT 12.5 09/19/2006 03:20 PM    PTT 25.9 02/13/2017 01:37 PM    INR 1.1 02/13/2017 01:37 PM        Radiology:  L hip xrays    IMPRESSION    1. AP view of the pelvis demonstrates symmetric appearance of the SI   joints bilaterally. Focal sclerosis about the inferior left SI joint   noted. This likely represents a bone island, and underlying bone lesion is   difficult to exclude. This is unchanged from the lumbar spine exam of September 12, 2016. Comparison with any prior studies may be beneficial. If symptoms   in this region consider additional evaluation.    2. There is severe arthrosis of the left hip. There is severe joint space   narrowing with bone-on-bone contact superiorly. Subchondral cystic and   sclerotic change in the femoral head. There is fullness of the left   lateral femoral head neck junction on the Dunn view which may be due to   the degenerative change though may predispose to femoral acetabular   impingement.    3. Incidental note of severe degenerative change of the right hip.    Dickey Gave, MD  Pager [phone]

## 2017-02-22 ENCOUNTER — Encounter: Admit: 2017-02-22 | Discharge: 2017-02-22 | Payer: MEDICARE

## 2017-02-22 NOTE — Telephone Encounter
Rescheduled patient for his THA to 02/28/17. Pt very appreciative,his last dose of ASA will be today. He verb understanding. No questions at this time.

## 2017-02-28 ENCOUNTER — Inpatient Hospital Stay: Admit: 2017-02-28 | Discharge: 2017-02-28 | Payer: MEDICARE

## 2017-02-28 ENCOUNTER — Encounter: Admit: 2017-02-28 | Discharge: 2017-02-28 | Payer: MEDICARE

## 2017-02-28 DIAGNOSIS — M25552 Pain in left hip: Secondary | ICD-10-CM

## 2017-02-28 LAB — POC GLUCOSE
Lab: 116 mg/dL — ABNORMAL HIGH (ref 70–100)
Lab: 173 mg/dL — ABNORMAL HIGH (ref 70–100)

## 2017-02-28 MED ORDER — MIDAZOLAM 1 MG/ML IJ SOLN
INTRAVENOUS | 0 refills | Status: DC
Start: 2017-02-28 — End: 2017-02-28
  Administered 2017-02-28: 18:00:00 2 mg via INTRAVENOUS

## 2017-02-28 MED ORDER — ACETAMINOPHEN 325 MG PO TAB
650 mg | ORAL | 0 refills | Status: DC | PRN
Start: 2017-02-28 — End: 2017-03-01
  Administered 2017-03-01 (×3): 650 mg via ORAL

## 2017-02-28 MED ORDER — ONDANSETRON HCL (PF) 4 MG/2 ML IJ SOLN
4 mg | Freq: Once | INTRAVENOUS | 0 refills | Status: DC | PRN
Start: 2017-02-28 — End: 2017-03-01

## 2017-02-28 MED ORDER — HYDROMORPHONE 2 MG/ML IJ SOLN
0 refills | Status: DC
Start: 2017-02-28 — End: 2017-02-28
  Administered 2017-02-28 (×4): 0.5 mg via INTRAVENOUS

## 2017-02-28 MED ORDER — FENTANYL CITRATE (PF) 50 MCG/ML IJ SOLN
0 refills | Status: DC
Start: 2017-02-28 — End: 2017-02-28

## 2017-02-28 MED ORDER — CEFAZOLIN INJ 1GM IVP
2 g | INTRAVENOUS | 0 refills | Status: CP
Start: 2017-02-28 — End: ?
  Administered 2017-03-01 (×2): 2 g via INTRAVENOUS

## 2017-02-28 MED ORDER — VANCOMYCIN 1,000 MG IV SOLR
0 refills | Status: DC
Start: 2017-02-28 — End: 2017-02-28
  Administered 2017-02-28: 20:00:00 1 g via TOPICAL

## 2017-02-28 MED ORDER — DIPHENHYDRAMINE HCL 25 MG PO CAP
25 mg | ORAL | 0 refills | Status: DC | PRN
Start: 2017-02-28 — End: 2017-03-03

## 2017-02-28 MED ORDER — BISACODYL 10 MG RE SUPP
10 mg | Freq: Every day | RECTAL | 0 refills | Status: DC | PRN
Start: 2017-02-28 — End: 2017-03-03
  Administered 2017-03-02: 20:00:00 10 mg via RECTAL

## 2017-02-28 MED ORDER — FENTANYL CITRATE (PF) 50 MCG/ML IJ SOLN
0 refills | Status: DC
Start: 2017-02-28 — End: 2017-02-28
  Administered 2017-02-28: 18:00:00 100 ug via INTRAVENOUS

## 2017-02-28 MED ORDER — FENTANYL CITRATE (PF) 50 MCG/ML IJ SOLN
25 ug | INTRAVENOUS | 0 refills | Status: DC | PRN
Start: 2017-02-28 — End: 2017-03-01

## 2017-02-28 MED ORDER — FENTANYL CITRATE (PF) 50 MCG/ML IJ SOLN
25-50 ug | INTRAVENOUS | 0 refills | Status: DC | PRN
Start: 2017-02-28 — End: 2017-03-03

## 2017-02-28 MED ORDER — LIDOCAINE (PF) 10 MG/ML (1 %) IJ SOLN
.1-2 mL | INTRAMUSCULAR | 0 refills | Status: CN | PRN
Start: 2017-02-28 — End: ?

## 2017-02-28 MED ORDER — LACTATED RINGERS IV SOLP
INTRAVENOUS | 0 refills | Status: DC
Start: 2017-02-28 — End: 2017-03-01

## 2017-02-28 MED ORDER — DEXAMETHASONE SODIUM PHOSPHATE 4 MG/ML IJ SOLN
INTRAVENOUS | 0 refills | Status: DC
Start: 2017-02-28 — End: 2017-02-28
  Administered 2017-02-28: 18:00:00 4 mg via INTRAVENOUS

## 2017-02-28 MED ORDER — DIPHENHYDRAMINE HCL 50 MG/ML IJ SOLN
25 mg | INTRAVENOUS | 0 refills | Status: DC | PRN
Start: 2017-02-28 — End: 2017-03-03

## 2017-02-28 MED ORDER — TRANEXAMIC ACID 1,000 MG/10 ML (100 MG/ML) IV SOLN
0 refills | Status: DC
Start: 2017-02-28 — End: 2017-02-28
  Administered 2017-02-28: 20:00:00 10 mL via TOPICAL

## 2017-02-28 MED ORDER — INSULIN ASPART 100 UNIT/ML SC FLEXPEN
0-7 [IU] | Freq: Before meals | SUBCUTANEOUS | 0 refills | Status: DC
Start: 2017-02-28 — End: 2017-03-03
  Administered 2017-03-01: 01:00:00 2 [IU] via SUBCUTANEOUS

## 2017-02-28 MED ORDER — SUGAMMADEX 100 MG/ML IV SOLN
INTRAVENOUS | 0 refills | Status: DC
Start: 2017-02-28 — End: 2017-02-28
  Administered 2017-02-28: 20:00:00 210 mg via INTRAVENOUS

## 2017-02-28 MED ORDER — ROCURONIUM 10 MG/ML IV SOLN
INTRAVENOUS | 0 refills | Status: DC
Start: 2017-02-28 — End: 2017-02-28
  Administered 2017-02-28: 18:00:00 10 mg via INTRAVENOUS
  Administered 2017-02-28: 18:00:00 50 mg via INTRAVENOUS
  Administered 2017-02-28 (×3): 10 mg via INTRAVENOUS

## 2017-02-28 MED ORDER — TRANEXAMIC ACID IN NS IVPB (AM)(OR)
1 g | Freq: Once | INTRAVENOUS | 0 refills | Status: CP
Start: 2017-02-28 — End: ?
  Administered 2017-02-28 (×2): 1 g via INTRAVENOUS

## 2017-02-28 MED ORDER — LACTATED RINGERS IV SOLP
INTRAVENOUS | 0 refills | Status: CN
Start: 2017-02-28 — End: ?

## 2017-02-28 MED ORDER — PROPOFOL INJ 10 MG/ML IV VIAL
0 refills | Status: DC
Start: 2017-02-28 — End: 2017-02-28

## 2017-02-28 MED ORDER — CITALOPRAM 20 MG PO TAB
20 mg | Freq: Every morning | ORAL | 0 refills | Status: DC
Start: 2017-02-28 — End: 2017-03-03
  Administered 2017-03-01 – 2017-03-03 (×3): 20 mg via ORAL

## 2017-02-28 MED ORDER — ATORVASTATIN 40 MG PO TAB
40 mg | Freq: Two times a day (BID) | ORAL | 0 refills | Status: DC
Start: 2017-02-28 — End: 2017-03-03
  Administered 2017-03-01 – 2017-03-03 (×5): 40 mg via ORAL

## 2017-02-28 MED ORDER — OXYCODONE 5 MG PO TAB
5-15 mg | ORAL | 0 refills | Status: DC | PRN
Start: 2017-02-28 — End: 2017-03-03
  Administered 2017-03-01: 20:00:00 15 mg via ORAL
  Administered 2017-03-01: 02:00:00 10 mg via ORAL
  Administered 2017-03-01 (×2): 15 mg via ORAL
  Administered 2017-03-01: 07:00:00 10 mg via ORAL
  Administered 2017-03-02 (×3): 15 mg via ORAL
  Administered 2017-03-02 (×2): 5 mg via ORAL
  Administered 2017-03-02 – 2017-03-03 (×4): 15 mg via ORAL

## 2017-02-28 MED ORDER — HYDROMORPHONE (PF) 2 MG/ML IJ SYRG
.5-1 mg | INTRAVENOUS | 0 refills | Status: DC | PRN
Start: 2017-02-28 — End: 2017-03-01
  Administered 2017-02-28: 21:00:00 0.5 mg via INTRAVENOUS
  Administered 2017-02-28: 21:00:00 1 mg via INTRAVENOUS
  Administered 2017-02-28: 21:00:00 0.5 mg via INTRAVENOUS

## 2017-02-28 MED ORDER — OXYCODONE SR 10 MG PO 12HR TABLET
10 mg | Freq: Once | ORAL | 0 refills | Status: CP
Start: 2017-02-28 — End: ?
  Administered 2017-02-28: 16:00:00 10 mg via ORAL

## 2017-02-28 MED ORDER — GABAPENTIN 300 MG PO CAP
300 mg | Freq: Once | ORAL | 0 refills | Status: CP
Start: 2017-02-28 — End: ?
  Administered 2017-02-28: 16:00:00 300 mg via ORAL

## 2017-02-28 MED ORDER — ASPIRIN 81 MG PO TBEC
81 mg | Freq: Every day | ORAL | 0 refills | Status: DC
Start: 2017-02-28 — End: 2017-03-03
  Administered 2017-03-01 – 2017-03-03 (×4): 81 mg via ORAL

## 2017-02-28 MED ORDER — LIDOCAINE (PF) 200 MG/10 ML (2 %) IJ SYRG
0 refills | Status: DC
Start: 2017-02-28 — End: 2017-02-28
  Administered 2017-02-28: 18:00:00 60 mg via INTRAVENOUS

## 2017-02-28 MED ORDER — CARVEDILOL 3.125 MG PO TAB
3.125 mg | Freq: Two times a day (BID) | ORAL | 0 refills | Status: DC
Start: 2017-02-28 — End: 2017-03-03
  Administered 2017-03-01 – 2017-03-03 (×3): 3.125 mg via ORAL

## 2017-02-28 MED ORDER — DOCUSATE SODIUM 100 MG PO CAP
100 mg | Freq: Two times a day (BID) | ORAL | 0 refills | Status: DC
Start: 2017-02-28 — End: 2017-03-03
  Administered 2017-03-01 – 2017-03-03 (×6): 100 mg via ORAL

## 2017-02-28 MED ORDER — MAGNESIUM HYDROXIDE 2,400 MG/10 ML PO SUSP
10 mL | Freq: Every day | ORAL | 0 refills | Status: DC
Start: 2017-02-28 — End: 2017-03-03
  Administered 2017-03-01 – 2017-03-03 (×3): 10 mL via ORAL

## 2017-02-28 MED ORDER — FENTANYL CITRATE (PF) 50 MCG/ML IJ SOLN
50 ug | INTRAVENOUS | 0 refills | Status: DC | PRN
Start: 2017-02-28 — End: 2017-03-01

## 2017-02-28 MED ORDER — ONDANSETRON HCL (PF) 4 MG/2 ML IJ SOLN
4 mg | INTRAVENOUS | 0 refills | Status: DC | PRN
Start: 2017-02-28 — End: 2017-03-03

## 2017-02-28 MED ORDER — ONDANSETRON HCL (PF) 4 MG/2 ML IJ SOLN
INTRAVENOUS | 0 refills | Status: DC
Start: 2017-02-28 — End: 2017-02-28
  Administered 2017-02-28: 20:00:00 4 mg via INTRAVENOUS

## 2017-02-28 MED ORDER — DEXTRAN 70-HYPROMELLOSE (PF) 0.1-0.3 % OP DPET
0 refills | Status: DC
Start: 2017-02-28 — End: 2017-02-28
  Administered 2017-02-28: 18:00:00 1 [drp] via OPHTHALMIC

## 2017-02-28 MED ORDER — CEFAZOLIN 1 GRAM IJ SOLR
0 refills | Status: DC
Start: 2017-02-28 — End: 2017-02-28
  Administered 2017-02-28: 18:00:00 2 g via INTRAVENOUS

## 2017-02-28 MED ORDER — PROPOFOL INJ 10 MG/ML IV VIAL
0 refills | Status: DC
Start: 2017-02-28 — End: 2017-02-28
  Administered 2017-02-28: 18:00:00 100 mg via INTRAVENOUS

## 2017-02-28 MED ORDER — OXYCODONE 5 MG PO TAB
5-10 mg | Freq: Once | ORAL | 0 refills | Status: CP | PRN
Start: 2017-02-28 — End: ?
  Administered 2017-02-28: 20:00:00 10 mg via ORAL

## 2017-02-28 MED ORDER — ACETAMINOPHEN 325 MG PO TAB
650 mg | Freq: Once | ORAL | 0 refills | Status: CP
Start: 2017-02-28 — End: ?
  Administered 2017-02-28: 16:00:00 650 mg via ORAL

## 2017-02-28 MED ORDER — ACETAMINOPHEN 650 MG RE SUPP
650 mg | RECTAL | 0 refills | Status: DC | PRN
Start: 2017-02-28 — End: 2017-03-01

## 2017-02-28 MED ADMIN — LACTATED RINGERS IV SOLP [4318]: 1000 mL | INTRAVENOUS | @ 16:00:00 | Stop: 2017-02-28 | NDC 00338011704

## 2017-02-28 NOTE — H&P (View-Only)
[facility] History & Physical Note  ???  ???  Admission Date: 02/21/2017                                                ???  Chief Complaint:  Left hip pain  ???  Assessment/Plan    71 y.o. male with Left hip pain/OA  ???  -Patient stopped ASA appropriately  - Proceed with THA today  ???  Patient  discussed with Dr. Opal Sidles who directs plan of care.  _____________________________________________________________________  ???  History of Present Illness: Dan Mccarty is a 71 y.o. male with L hip pain.  Pt presents with continued complaints of L hip pain which has failed non-op measures including NSAIDs, activity modification, injections, and PT. ???The risks/benefits of surgery were discussed along with alternatives and she would like to proceed with operative intervention.   ???       Past Medical History:   Diagnosis Date   ??? Anxiety 09/12/2010   ??? Arthritis ???   ??? lower back   ??? Atrial fibrillation (HCC) 09/26/2010   ??? s/p atrial ablation surgery 2012   ??? Atrial flutter (HCC) 02/22/2011   ??? s/p atrial ablation surgery 2012   ??? Chest pain ???   ??? Chest pain 09/20/2006   ??? 09/19/06 Heart Cath:  1. Normal left ventricular size, systolic function and hemodynamics.  2. Anterolateral hypokinesis.  3. 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 Chronic kidney disease ???   ??? stage 3   ??? Depression 09/12/2010   ??? Diabetes (HCC) 09/12/2010   ??? Diabetes mellitus type II 09/12/2010   ??? HTN (hypertension) ???   ??? Hx of sleep apnea ???   ??? does not use CPAP after 70 lb weight loss and sinus issues   ??? Hyperlipidemia 09/20/2006   ??? Hypertension 09/20/2006   ??? Obesity 09/12/2010   ??? S/P ablation of atrial flutter 01/29/2013   ??? 2012 by Dr. Wallene Huh   ???  ???        Past Surgical History:   Procedure Laterality Date   ??? ATRIAL ABLATION SURGERY ??? 2012   ??? CARDIAC CATHERIZATION ??? ???   ??? CHOLECYSTECTOMY ??? ???   ??? HX TONSILLECTOMY ??? ???   ??? KNEE ARTHROSCOPY Left ???   ???  ???        Social History   Substance Use Topics ??? Smoking status: Former Smoker   ??? ??? Years: 4.00   ??? ??? Quit date: 02/28/1970   ??? Smokeless tobacco: Never Used   ??? Alcohol use No   ???  ???        Family History   Problem Relation Age of Onset   ??? Hypertension Mother ???   ??? Diabetes Mother ???   ??? Stroke Mother ???      Family history reviewed with patient and is non-contributory.  ???  Allergies:  Adhesive tape (rosins) and Nsaids (non-steroidal anti-inflammatory drug)  ???      Current Facility-Administered Medications   Medication   ??? ceFAZolin (ANCEF) IVP 2 g   ??? lidocaine PF 1% (10 mg/mL) injection 0.1-2 mL   ??? sodium chloride 0.9 %   infusion   ???     Review of Systems:  10 point review of systems completed  and negative unless otherwise noted in HPI  ???  Vital Signs:  Last Filed in 24 hours   BP: 131/82 (11/29 0646)  Temp: 36.5 ???C (97.7 ???F) (11/29 1610)  Pulse: 66 (11/29 0646)  Respirations: 14 PER MINUTE (11/29 0646)  SpO2: 100 % (11/29 0646)  O2 Delivery: None (Room Air) (11/29 0646)  SpO2 Pulse: 66 (11/29 0646)  Height: 182.9 cm (72) (11/29 0646)   ???  Physical Exam:  ???  General: Alert, NAD  ENT: Oropharynx/Nasopharynx clear  Respiratory: Unlabored respirations  Cardio: RRR  GI: soft, NT, ND  Skin: Normal turgor  Musculoskeletal: Musculoskeletal: Exam of Left Lower Extremity  ???????????????????????????????????????- Hip ROM 0-110, obligate ER, pain with IR  ???????????????????????????????????????- Stinchfield positive  ???????????????????????????????????????- Strength: ???Normal strength in hip flexion, knee extension, ankle dorsiflexion  ???????????????????????????????????????- Palpation: No tenderness to palpation over the greater trochanter  ???????????????????????????????????????- Dorsalis pedis 2+ pulse, foot warm and well perfused.  Neurologic: Grossly intact, moving all extremities spontaneously  ???  Lab/Radiology/Other Diagnostic Tests:  ???  CBC w/Diff           Lab Results   Component Value Date/Time   ??? WBC 9.4 02/13/2017 01:37 PM   ??? HGB 12.1 (L) 02/13/2017 01:37 PM   ??? HCT 36.4 (L) 02/13/2017 01:37 PM   ??? PLTCT 310 02/13/2017 01:37 PM   ???      ???  Basic Metabolic Profile Lab Results   Component Value Date/Time   ??? NA 137 02/13/2017 01:37 PM   ??? K 3.6 02/13/2017 01:37 PM   ??? CL 104 02/13/2017 01:37 PM   ??? CO2 23 02/13/2017 01:37 PM   ??? GAP 10 02/13/2017 01:37 PM   ??? BUN 30 (H) 02/13/2017 01:37 PM   ??? CR 1.22 02/13/2017 01:37 PM   ??? GLU 142 (H) 02/13/2017 01:37 PM   ??? GLU DUPLICATE ORDER 09/12/2010 06:15 PM   ???   ???  Coagulation Studies           Lab Results   Component Value Date/Time   ??? PT 12.5 09/19/2006 03:20 PM   ??? PTT 25.9 02/13/2017 01:37 PM   ??? INR 1.1 02/13/2017 01:37 PM   ???   ???  Radiology:  L hip xrays    IMPRESSION    1. AP view of the pelvis demonstrates symmetric appearance of the SI   joints bilaterally. Focal sclerosis about the inferior left SI joint   noted. This likely represents a bone island, and underlying bone lesion is   difficult to exclude. This is unchanged from the lumbar spine exam of September 12, 2016. Comparison with any prior studies may be beneficial. If symptoms   in this region consider additional evaluation.    2. There is severe arthrosis of the left hip. There is severe joint space   narrowing with bone-on-bone contact superiorly. Subchondral cystic and   sclerotic change in the femoral head. There is fullness of the left   lateral femoral head neck junction on the Dunn view which may be due to   the degenerative change though may predispose to femoral acetabular   impingement.    3. Incidental note of severe degenerative change of the right hip.  ???

## 2017-02-28 NOTE — Other
Brief Operative Note    Name: Dan Mccarty is a 71 y.o. male     DOB: [birth year]             MRN#: [id number]  DATE OF OPERATION: 02/28/2017    Date:  02/28/2017        Preoperative Dx:   Primary osteoarthritis of left hip [M16.12]    Post-op Diagnosis      * Primary osteoarthritis of left hip [M16.12]    Procedure(s) (LRB):  LEFT TOTAL HIP ARTHROPLASTY (Left)    Anesthesia Type: General    Surgeon(s) and Role:     Sula Rumple* Sweeney, Kyle R, MD - Primary     * Casey Burkitteinhardt, , MD - Resident - Assisting      Findings:  Hip OA    Estimated Blood Loss: 75 ml    Specimen(s) Removed/Disposition:   ID Type Source Tests Collected by Time Destination   1 : LEFT FEMORAL HEAD FOR **DISPOSAL ONLY** Bone Femoral Head SURGICAL PATHOLOGY          Sula RumpleSweeney, Kyle R, MD 02/28/2017 1333        Complications:  None    Implants: see dictated note    Drains: None    Disposition:  PACU - stable    Casey Burkittaniel , MD  Pager

## 2017-02-28 NOTE — Anesthesia Pre-Procedure Evaluation
Anesthesia Pre-Procedure Evaluation    Name: Dan Mccarty      MRN: [id number]     DOB: May 25, 1945     Age: 71 y.o.     Sex: male   __________________________________________________________________________     Procedure Date: 02/28/2017   Procedure: Procedure(s) with comments:  LEFT TOTAL HIP ARTHROPLASTY - CASE LENGTH 1.5 HOURS, LATERAL POSITION ON PEG BOARD, STRYKER IMPLANTS     Physical Assessment  Vital Signs (last filed in past 24 hours):  O2 Delivery: None (Room Air) (12/06 4540)      Patient History  Allergies   Allergen Reactions   ??? Adhesive Tape (Rosins) RASH   ??? Nsaids (Non-Steroidal Anti-Inflammatory Drug) SEE COMMENTS     Stage 3 kidney disease        Patient surgery rescheduled after surgical cancellation in November after patient had taken ASA morning of surgery.    Current Medications    Medication Directions   acetaminophen (TYLENOL) 500 mg tablet Take 1,000 mg by mouth every 6 hours as needed for Pain. Max of 4,000 mg of acetaminophen in 24 hours.   aspirin EC 81 mg tablet Take 81 mg by mouth daily with breakfast.   atorvastatin (LIPITOR) 40 mg tablet TAKE 1 TABLET BY MOUTH  DAILY  Patient taking differently: Take 40 mg by mouth twice daily.   carvedilol (COREG) 3.125 mg tablet Take 3.125 mg by mouth twice daily with meals. Take with food.   citalopram (CELEXA) 20 mg tablet Take 20 mg by mouth every morning.   hydroCHLOROthiazide (HYDRODIURIL) 25 mg tablet TAKE 1 TABLET BY MOUTH  EVERY MORNING   losartan(+) (COZAAR) 100 mg tablet TAKE 1 TABLET BY MOUTH  DAILY  Patient taking differently: Take 100 mg by mouth daily with breakfast.   metFORMIN (GLUCOPHAGE) 1,000 mg tablet Take 1 Tab by mouth twice daily.   nitroglycerin (NITROSTAT) 0.4 mg tablet 1 Tab as Needed for Chest Pain.   OMEGA-3S-DHA-EPA-FISH OIL PO Take 1,400 mg by mouth twice daily. 2 caps in the am, 1 cap in the pm    other medication Equate No Drip - 1-2 sprays into both nostril daily potassium chloride SR (KLOR-CON M20) 20 mEq tablet Take 20 mEq by mouth daily with breakfast.   vitamins, multiple Cap Take 1 capsule by mouth daily with breakfast.         Review of Systems/Medical History      Patient summary reviewed  Nursing notes reviewed  Pertinent labs reviewed    PONV Screening: Non-smoker and Postoperative opioids  No history of anesthetic complications  No family history of anesthetic complications        Pulmonary       Not a current smoker        No indications/hx of asthma    no COPD      No recent URI      No sleep apnea      Cardiovascular       Recent diagnostic studies:          ECG, echocardiogram and stress test            Chemical stress 02/11/2017  SUMMARY/OPINION:??????This study is probably normal without convincing evidence of significant myocardial ischemia. There is mild intensity, small sized reversible perfusion defect involving mid to basal inferior wall. It is likely secondary to  diaphragmatic attenuation.  Left ventricular systolic function is normal. There are no high risk prognostic indicators present.  The ECG portion of the study is  negative for ischemia.  In aggregate the current study is low risk in regards to predicted annual cardiovascular mortality rate.     Echo 02/08/2017  1. Endocardial definition is limited even with the use of contrast. ???However, no left ventricular regional wall motion abnormalities are seen. Overall LV systolic function appears normal. The estimated left ventricular ejection fraction appears to be in the range of 60%.  2. There is mild concentric left ventricular hypertrophy.  3. Right ventricular systolic function appears visually normal, although the TAPSE is reduced.  4. Mild right atrial enlargement.  5. Cardiac valve structures are not visualized well.  However, there is no evidence of significant valvular regurgitation or stenosis by doppler exam.  6. A trivial pericardial effusion or epicardial fat pad is noted adjacent to the right ventricle.      Exercise tolerance: <4 METS       Beta Blocker therapy: Yes        Hypertension, well controlled      Dysrhythmias (paroxysmal atrial flutter)      Hyperlipidemia      No dyspnea on exertion      No syncope      GI/Hepatic/Renal         No GERD,       No hx of liver disease       Renal disease (stage III): CRI       Neuro/Psych       No seizures      No CVA        Psychiatric history          Depression      Musculoskeletal         Arthritis      Endocrine/Other       Diabetes, well controlled, type 2      Obesity   Physical Exam    Airway Findings      Mallampati: I      TM distance: >3 FB      Neck ROM: full      Mouth opening: good      Airway patency: adequate    Dental Findings: Negative      Cardiovascular Findings:       Rhythm: regular      Rate: normal      No murmur, no carotid bruit    Pulmonary Findings:       Breath sounds clear to auscultation. No wheezes.    Abdominal Findings:       Obese      Abdomen soft    Neurological Findings:       Normal mental status       Diagnostic Tests  Hematology:   Lab Results   Component Value Date    HGB 12.1 02/13/2017    HCT 36.4 02/13/2017    PLTCT 310 02/13/2017    WBC 9.4 02/13/2017    NEUT 56 02/13/2017    ANC 5.20 02/13/2017    ALC 2.90 02/13/2017    MONA 8 02/13/2017    AMC 0.80 02/13/2017    EOSA 5 02/13/2017    ABC 0.00 02/13/2017    MCV 92.8 02/13/2017    MCH 30.8 02/13/2017    MCHC 33.2 02/13/2017    MPV 8.1 02/13/2017    RDW 13.6 02/13/2017         General Chemistry:   Lab Results   Component Value Date    NA 137 02/13/2017    K 3.6 02/13/2017  CL 104 02/13/2017    CO2 23 02/13/2017    GAP 10 02/13/2017    BUN 30 02/13/2017    CR 1.22 02/13/2017    GLU 142 02/13/2017    GLU DUPLICATE ORDER 09/12/2010    CA 9.7 02/13/2017    ALBUMIN 4.4 02/13/2017    MG 2.0 09/20/2006    TOTBILI 0.6 02/13/2017      Coagulation:   Lab Results   Component Value Date    PT 12.5 09/19/2006    PTT 25.9 02/13/2017    INR 1.1 02/13/2017 Anesthesia Plan    ASA score: 3   Plan: general and regional for postoperative pain  Induction method: intravenous  NPO status: acceptable      Informed Consent  Anesthetic plan and risks discussed with patient.  Use of blood products discussed with patient  Blood Consent: consented      Plan discussed with: anesthesiologist and CRNA.

## 2017-02-28 NOTE — Anesthesia Post-Procedure Evaluation
Post-Anesthesia Evaluation    Name: Dan Mccarty      MRN: [id number]     DOB: 09/05/1945     Age: 71 y.o.     Sex: male   __________________________________________________________________________     Procedure Date: 02/28/2017  Procedure: Procedure(s) with comments:  LEFT TOTAL HIP ARTHROPLASTY - CASE LENGTH 1.5 HOURS, LATERAL POSITION ON PEG BOARD, STRYKER IMPLANTS      Surgeon: Surgeon(s):  Sula RumpleSweeney, Kyle R, MD  Casey Burkitteinhardt, Daniel, MD    Post-Anesthesia Vitals  BP: 123/81 (12/06 1500)  Pulse: 85 (12/06 1500)  Respirations: 15 PER MINUTE (12/06 1500)  SpO2: 100 % (12/06 1500)  O2 Delivery: Nasal Cannula (12/06 1500)  SpO2 Pulse: 85 (12/06 1500)      Post Anesthesia Evaluation Note    Evaluation location: Pre/Post  Patient participation: recovered; patient participated in evaluation  Level of consciousness: alert    Pain score: 5  Pain management: adequate    Hydration: normovolemia  Temperature: 36.0C - 38.4C  Airway patency: adequate    Perioperative Events  Perioperative events:  no       Post-op nausea and vomiting: no PONV    Postoperative Status  Cardiovascular status: hemodynamically stable  Respiratory status: spontaneous ventilation        Perioperative Events

## 2017-03-01 ENCOUNTER — Encounter: Admit: 2017-03-01 | Discharge: 2017-03-01 | Payer: MEDICARE

## 2017-03-01 DIAGNOSIS — Z8669 Personal history of other diseases of the nervous system and sense organs: ICD-10-CM

## 2017-03-01 DIAGNOSIS — F419 Anxiety disorder, unspecified: ICD-10-CM

## 2017-03-01 DIAGNOSIS — N189 Chronic kidney disease, unspecified: ICD-10-CM

## 2017-03-01 DIAGNOSIS — M1990 Unspecified osteoarthritis, unspecified site: ICD-10-CM

## 2017-03-01 DIAGNOSIS — I4891 Unspecified atrial fibrillation: ICD-10-CM

## 2017-03-01 DIAGNOSIS — E669 Obesity, unspecified: ICD-10-CM

## 2017-03-01 DIAGNOSIS — Z9889 Other specified postprocedural states: ICD-10-CM

## 2017-03-01 DIAGNOSIS — F329 Major depressive disorder, single episode, unspecified: ICD-10-CM

## 2017-03-01 DIAGNOSIS — E119 Type 2 diabetes mellitus without complications: ICD-10-CM

## 2017-03-01 DIAGNOSIS — I10 Essential (primary) hypertension: Secondary | ICD-10-CM

## 2017-03-01 DIAGNOSIS — E785 Hyperlipidemia, unspecified: ICD-10-CM

## 2017-03-01 DIAGNOSIS — R079 Chest pain, unspecified: ICD-10-CM

## 2017-03-01 DIAGNOSIS — I4892 Unspecified atrial flutter: ICD-10-CM

## 2017-03-01 LAB — BASIC METABOLIC PANEL
Lab: 135 MMOL/L — ABNORMAL LOW (ref 137–147)
Lab: 4.2 MMOL/L — ABNORMAL LOW (ref 3.5–5.1)

## 2017-03-01 LAB — POC GLUCOSE
Lab: 211 mg/dL — ABNORMAL HIGH (ref 70–100)
Lab: 180 mg/dL — ABNORMAL HIGH (ref 70–100)
Lab: 175 mg/dL — ABNORMAL HIGH (ref 70–100)
Lab: 154 mg/dL — ABNORMAL HIGH (ref 70–100)
Lab: 130 mg/dL — ABNORMAL HIGH (ref 70–100)

## 2017-03-01 LAB — CBC
Lab: 12 10*3/uL — ABNORMAL HIGH (ref 4.5–11.0)
Lab: 3.6 M/UL — ABNORMAL LOW (ref >60)

## 2017-03-01 MED ORDER — DOCUSATE SODIUM 100 MG PO CAP
100 mg | ORAL_CAPSULE | Freq: Two times a day (BID) | ORAL | 3 refills | Status: SS | PRN
Start: 2017-03-01 — End: 2017-05-29

## 2017-03-01 MED ORDER — ACETAMINOPHEN 500 MG PO TAB
1000 mg | ORAL | 0 refills | Status: DC
Start: 2017-03-01 — End: 2017-03-03
  Administered 2017-03-01 – 2017-03-03 (×8): 1000 mg via ORAL

## 2017-03-01 MED ORDER — OXYCODONE 5 MG PO TAB
5-15 mg | ORAL_TABLET | ORAL | 0 refills | 6.00000 days | Status: AC | PRN
Start: 2017-03-01 — End: 2017-03-01

## 2017-03-01 MED ORDER — OXYCODONE 5 MG PO TAB
5-15 mg | ORAL_TABLET | ORAL | 0 refills | 6.00000 days | Status: AC | PRN
Start: 2017-03-01 — End: 2017-03-08
  Filled 2017-03-03: qty 50, 5d supply, fill #1

## 2017-03-01 MED ORDER — ASPIRIN 81 MG PO TBEC
81 mg | ORAL_TABLET | Freq: Every day | ORAL | 3 refills | Status: AC
Start: 2017-03-01 — End: 2017-05-31

## 2017-03-01 MED ORDER — OXYCODONE 5 MG PO TAB
5-15 mg | ORAL_TABLET | ORAL | 0 refills | 6.00000 days | Status: AC | PRN
Start: 2017-03-01 — End: 2017-03-01
  Filled 2017-03-03: qty 50, 5d supply, fill #1

## 2017-03-01 MED ORDER — ACETAMINOPHEN 500 MG PO TAB
1000 mg | ORAL | 0 refills | Status: DC | PRN
Start: 2017-03-01 — End: 2017-03-01

## 2017-03-01 NOTE — Progress Notes
Patient arrived to room # (4310-2*) via cart accompanied by transport. Patient transferred to the bed with assistance. Bedside safety checks completed. Initial patient assessment completed, refer to flowsheet for details. Admission skin assessment completed by: Lynita LombardJennifer D, RN & Marcial PCA    Pressure Injury Present on Hospital Admission (within 24 hours): No    1. Occiput: No  2. Ear: No  3. Scapula: No  4. Spinous Process: No  5. Shoulder: No  6. Elbow: No  7. Iliac Crest: No  8. Sacrum/Coccyx: No  9. Ischial Tuberosity: No  10. Trochanter: No  11. Knee: No  12. Malleolus: No  13. Heel: No  14. Toes: No  15. Assessed for device associated injury No  16. Nursing Nutrition Assessment Completed No    See Doc Flowsheet for additional wound details.     INTERVENTIONS:     Blanchable redness on sacrum/coccyx, encourage frequent repositioning.  Scab- inner aspect of L knee, blanchable at edge, prevent device pressure at site.

## 2017-03-01 NOTE — Anesthesia Pain Rounding
Anesthesia Follow-Up Evaluation: Post-Procedure Day One    Name: Dan Mccarty     MRN: [id number]     DOB: Feb 20, 1946     Age: 71 y.o.     Sex: male   __________________________________________________________________________     Procedure Date: 02/28/2017   Procedure: Procedure(s) with comments:  LEFT TOTAL HIP ARTHROPLASTY - CASE LENGTH 1.5 HOURS, LATERAL POSITION ON PEG BOARD, STRYKER IMPLANTS    Physical Assessment  Height: 182.9 cm (72)  Weight: 104.8 kg (231 lb)    Vital Signs (Last Filed in 24 hours)  BP: 122/45 (12/07 1001)  Temp: 36.4 ???C (97.5 ???F) (12/07 1001)  Pulse: 80 (12/07 1001)  Respirations: 16 PER MINUTE (12/07 1001)  SpO2: 98 % (12/07 1001)  O2 Delivery: None (Room Air) (12/07 1001)  SpO2 Pulse: 98 (12/06 1800)    Patient History   Allergies  Allergies   Allergen Reactions   ??? Adhesive Tape (Rosins) RASH   ??? Nsaids (Non-Steroidal Anti-Inflammatory Drug) SEE COMMENTS     Stage 3 kidney disease        Medications  Scheduled Meds:  acetaminophen (TYLENOL) tablet 1,000 mg 1,000 mg Oral Q6H*   aspirin EC tablet 81 mg 81 mg Oral QDAY w/breakfast   atorvastatin (LIPITOR) tablet 40 mg 40 mg Oral BID   carvedilol (COREG) tablet 3.125 mg 3.125 mg Oral BID w/meals   citalopram (CELEXA) tablet 20 mg 20 mg Oral QAM8   docusate (COLACE) capsule 100 mg 100 mg Oral BID(9-17)   insulin aspart U-100 (NOVOLOG FLEXPEN) injection PEN 0-7 Units 0-7 Units Subcutaneous ACHS   milk of magnesia (CONC) oral suspension 10 mL 10 mL Oral QDAY(21)   Continuous Infusions:  PRN and Respiratory Meds:bisacodyl QDAY PRN, diphenhydrAMINE Q6H PRN **OR** diphenhydrAMINE Q6H PRN, fentaNYL citrate PF Q1H PRN, ondansetron (ZOFRAN) IV Q6H PRN, oxyCODONE Q4H PRN      Diagnostic Tests  Hematology: Lab Results   Component Value Date    HGB 11.1 03/01/2017    HCT 33.1 03/01/2017    PLTCT 255 03/01/2017    WBC 12.7 03/01/2017    NEUT 56 02/13/2017    ANC 5.20 02/13/2017    ALC 2.90 02/13/2017    MONA 8 02/13/2017    AMC 0.80 02/13/2017 EOSA 5 02/13/2017    ABC 0.00 02/13/2017    MCV 91.8 03/01/2017    MCH 31.0 03/01/2017    MCHC 33.7 03/01/2017    MPV 7.7 03/01/2017    RDW 13.9 03/01/2017         General Chemistry: Lab Results   Component Value Date    NA 135 03/01/2017    K 4.2 03/01/2017    CL 103 03/01/2017    CO2 24 03/01/2017    GAP 8 03/01/2017    BUN 27 03/01/2017    CR 1.14 03/01/2017    GLU 151 03/01/2017    GLU DUPLICATE ORDER 09/12/2010    CA 9.0 03/01/2017    ALBUMIN 4.4 02/13/2017    MG 2.0 09/20/2006    TOTBILI 0.6 02/13/2017      Coagulation:   Lab Results   Component Value Date    PT 12.5 09/19/2006    PTT 25.9 02/13/2017    INR 1.1 02/13/2017         Follow-Up Assessment  Patient location during evaluation: floor      Anesthetic Complications:   Anesthetic complications: The patient did not experience any anesthestic complications.      Pain:  Score: 3  Management:adequate     Level of Consciousness: awake and alert   Hydration:euvolemic     Airway Patency: patent   Respiratory Status: acceptable and room air     Cardiovascular Status:acceptable and hemodynamically stable   Regional/Neuroaxial:

## 2017-03-01 NOTE — Patient Education
This RN initiated education binder and left at patient's bedside for review with day RN.

## 2017-03-01 NOTE — Progress Notes
Orthopedic Surgery Progress Note    S: No acute events.  Pain well-controlled.  Patient tolerating current diet.      O:    Blood pressure 116/68, pulse 72, temperature 36.9 C (98.5 F), height 182.9 cm (72"), weight 104.8 kg (231 lb), SpO2 98 %.     GEN: A&O, NAD  CV: Normal rate, normal rhythm  PULM: Non-labored  ABD: Soft, NT, ND  EXTREM: LLE f/e great toe and ankle, SILT to foot, dressing c/d/i    DRAINS (last 2 shifts): n/a mL    Complete Blood Counts   Recent Labs      03/01/17   0455   HGB  11.1*   HCT  33.1*   WBC  12.7*   PLTCT  255        Chemistry Panel   Recent Labs      03/01/17   0455   NA  135*   K  4.2   CL  103   CO2  24   BUN  27*   CR  1.14   GLU  151*   CA  9.0        A: 68M s/p L THA    P:   -PT/OT: WBAT, PHP  -Diet: regular  -Continue current pain Rx  -Lytes replaced PRN  -Acute blood loss anemia: Monitor CBC's daily.     -PPx: Bowel regimen, RT protocol, ASA, SCD's    DISPOSITION: Continue inpatient pending PT    Maurice Smallan , MD  [phone]

## 2017-03-01 NOTE — Case Management (ED)
CMA Note    Delivered a standard roller walker from [employer] supply room to pt, per request from Crystal Richardson, RNCM.          Case Management Assistant  For additional assistance, please contact Crystal Richardson, RNCM *[phone]

## 2017-03-01 NOTE — Progress Notes
OCCUPATIONAL THERAPY  ASSESSMENT NOTE    Patient Name: Dan Mccarty                   Room/Bed: [id number]/04  Admitting Diagnosis: Primary osteoarthritis of left hip [M16.12]    Past Medical History:   Diagnosis Date   ??? Anxiety 09/12/2010   ??? Arthritis     lower back   ??? Atrial fibrillation (HCC) 09/26/2010    s/p atrial ablation surgery 2012   ??? Atrial flutter (HCC) 02/22/2011    s/p atrial ablation surgery 2012   ??? Chest pain    ??? Chest pain 09/20/2006    09/19/06 Heart Cath:  1. Normal left ventricular size, systolic function and hemodynamics.  2. Anterolateral hypokinesis.  3. 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 Chronic kidney disease     stage 3   ??? Depression 09/12/2010   ??? Diabetes (HCC) 09/12/2010   ??? Diabetes mellitus type II 09/12/2010   ??? HTN (hypertension)    ??? Hx of sleep apnea     does not use CPAP after 70 lb weight loss and sinus issues   ??? Hyperlipidemia 09/20/2006   ??? Hypertension 09/20/2006   ??? Obesity 09/12/2010   ??? S/P ablation of atrial flutter 01/29/2013    2012 by Dr. Wallene Huh       Mobility  Progressive Mobility Level: Walk in room  Distance Walked (feet): 25 ft  Level of Assistance: Assist X1  Assistive Device: Walker  Time Tolerated: 11-30 minutes  Activity Limited By: Pain    Subjective  L LE Precautions: LLE WBAT: Weight Bearing As Tolerated;L Posterior Hip Precautions  Pain / Complaints: Patient agrees to participate in therapy  Pain Location: Left;Hip  Pain Level Current: 5    Objective  Psychosocial Status: Willing and Cooperative to Participate  Persons Present: Physical Therapist;Occupational Therapist    Home Living  Type of Home: House  Home Layout: Two Level  Financial risk analyst / Tub: Pension scheme manager: Raised  Bathroom Equipment: Government social research officer;Shower Chair  Comment: 31 steps to enter from front, 2 step from back    Prior Function  Level Of Independence: Independent with ADLs and functional transfers;Independent with homemaking w/ ambulation  Lives With: Spouse;Family  Receives Help From: Family    Vision  Current Vision: Wears Glasses Only for Reading    ADL's  Where Assessed: Chair;In [employer]  Equipment Provided: Reacher  LE Dressing Assist: Stand By Assist  LE Dressing Deficits: Verbal Cueing;Increased Time To Complete  Toileting Assist: Stand By Assist  Functional Transfer Assist: Stand By Assist  Functional Transfer Deficits: Toilet Transfer  Comments: Pt received sitting on EOB, sit to stand SBA, pt ambulates to chair using FWW SBA. Pt t/f to chair SBA, cues to extend L LE during sitting to maintain hip px. Pt educated on hip px, given handout pt verbalizes understanding. Pt completes grooming with set-up seated in chair. Pt doffs socks using reacher SBA, pt reports family will assist with donning socks and shoes. Dep to don socks. Pt educated in reacher to doff/don briefs over feet, completes SBA. Pt transitions to standing SBA, ambulates to bathroom using FWW SBA, toilet t/f SBA using gb, toileting SBA. Sit to stand SBA. Pt ambulates to chair SBA using FWW, pt left sitting upright in chair, educated on ways to modify recliner for home.     Cognition  Overall Cognitive Status: WFL to Adequately  Complete Self Care Tasks Safely      UE AROM  Overall BUE AROM WNL: Yes  Grasp: Bilateral Grasp Functional for Activity    UE Strength / Tone  Overall Strength / Tone: WFL Able to Perform ADL Tasks    Education  Persons Educated: Patient  Barriers To Learning: None Noted  Teaching Methods: Verbal Instruction;Demonstration  Patient Response: Verbalized and Demo Understanding  Topics: Role of OT, Goals for Therapy;DME for Home Discharge;ADL Compensatory Techniques  Goal Formulation: With Patient    Assessment  Assessment: Decreased Endurance;Decreased High-Level ADLs  Prognosis: Good;w/ Family  Goal Formulation: Patient  No Skilled OT: Safe To Return Home    AM-PAC 6 Clicks Daily Activity Inpatient Putting on and taking off regular lower body clothes?: A Little  Bathing (Including washing, rinsing, drying): A Little  Toileting, which includes using toilet, bedpan, or urinal: None  Putting on and taking off regular upper body clothing: None  Taking care of personal grooming such as brushing teeth: None  Eating meals?: None  Daily Activity Raw Score: 22  Standardized (t-scale) score: 47.1  CMS 0-100% Score: 25.8  CMS G Code Modifier: CJ     G-Code    G-Codes: Self-care  [id number] Current Status: 1-19% Impairment  G8988 Goal Status:  0% Impairment  [id number] Discharge Status: 1-19% Impairment       Based on above evaluation and clinical judgment.      Plan  Progress: No Further Limitations  OT Frequency: No Further Treatment    OT Discharge Recommendations  OT Discharge Recommendations: Home with family assist  Equipment Recommendations: Patient owns necessary equipment  Recommend ongoing assistance for: In and out of house, Stairs      Therapist: Raymon Mutton, OTR/L  Date: 03/01/2017

## 2017-03-01 NOTE — Progress Notes
PHYSICAL THERAPY  PROGRESS NOTE       MOBILITY:  Mobility  Progressive Mobility Level: Walk in hallway  Distance Walked (feet): 85 ft  Level of Assistance: Assist X1  Assistive Device: Walker  Time Tolerated: 11-30 minutes  Activity Limited By: Pain    SUBJECTIVE:  Subjective  Significant hospital events: 46M s/p L THA 12/6  Mental / Cognitive Status: Alert;Oriented;Cooperative  Persons Present: Nursing Staff  Pain: Patient complains of pain;6/10;After activity  Pain Location: Left;Hip;Post-surgical  Pain Interventions: Patient agrees to participate in therapy with modifications to session;Patient assisted into position of comfort;Nursing staff notified of patient's pain level;Patient pre-medicated  L LE Precautions: LLE WBAT: Weight Bearing As Tolerated;L Posterior Hip Precautions  Ambulation Assist: Independent Mobility in Community without Device  Patient Owned Equipment: None  Home Situation: Lives with Family;Has 24/7 Assistance Available(wife and daughter)  Type of Home: House  Entry Stairs: 1-2 Flights of Stairs;Rail on 1 Side(31 in through front, 2 in through back)  In-Home Stairs: Able to Live on One Level    BED MOBILITY/TRANSFERS:  Bed Mobility/Transfers  Bed Mobility: Sit to Supine: Minimal Assist;Assist with L LE;Bed Flat;No Rail  Transfer Type: Sit to/from Stand  Transfer: Assistance Level: From;Bed Side Chair;To;Bed;Minimal Assist  Transfer: Assistive Device: Nurse, adult  Transfers: Type Of Assistance: Elevated Bed;For Balance;For Safety Considerations;Requires Extra Time  End Of Activity Status: In Bed;Nursing Notified;Instructed Patient to Use Call Light    GAIT:  Gait  Gait Distance: 85 feet  Gait: Assistive Device: Roller Walker  Comments: Pt initially required minimal assistance for balance/safety due to L knee buckling during ambulation, however pt progressed to performing last half of ambulation with standby assistance and improved L knee stability in stance phase. Stairs: Number Climbed: (not safe to trial this session due to L knee buckling)    ACTIVITY/EXERCISE:  Activity / Exercise  Exercise: Supine;LLE;Total Hip(Reviewed handout with pt)  Exercise Repetitions: 10  Comments: Encouraged pt to begin performing L LE total hip exercises on own 2-3 times daily for improved L hip strength/ROM.  Pt verbalized understanding.    EDUCATION:  Education  Persons Educated: Patient  Patient Barriers To Learning: None Noted  Teaching Methods: Verbal Instruction  Patient Response: Verbalized Understanding;More Instruction Required  Topics: Plan/Goals of PT Interventions;Use of Assistive Device/Orthosis;Mobility Progression;Precautions;Ambulate With Nursing;Recommend Continued Therapy    ASSESSMENT/PROGRESS:  Assessment/Progress  Impaired Mobility Due To: Pain;Post Surgical Changes;Post Surgical Precautions  AM-PAC 6 Clicks Basic Mobility Inpatient  Turning from your back to your side while in a flat bed without using bed rails: A Little  Moving from lying on your back to sitting on the side of a flatbed without using bedrails : A Little  Moving to and from a bed to a chair (including a wheelchair): A Little  Standing up from a chair using your arms (e.g. wheelchair, or bedside chair): A Little  To walk in hospital room: A Little  Climbing 3-5 steps with a railing: A Lot  Raw Score: 17  Standardized (T-scale) Score: 39.67  Basic Mobility CMS 0-100%: 43.83  CMS G Code Modifier for Basic Mobility: CK    GOALS:  Goals  Goal Formulation: With Patient  Time For Goal Achievement: 3 days  Pt Will Go Supine To/From Sit: Independently  Pt Will Transfer Sit to Stand: Independently  Pt Will Ambulate: Greater than 200 Feet, w/ Walker, Independently  Pt Will Go Up / Down Stairs: 1-2 Flights, w/ Minimal Assist    PLAN:  Plan  Treatment Interventions: Mobility Training;Endurance Training;Family Training  Plan Frequency: 1-2 Times per Day PT Plan for Next Visit: Trial stairs next session, increase ambulation distance, review posterior hip precautions and home exercise program.    RECOMMENDATIONS:  PT Discharge Recommendations  PT Discharge Recommendations: Home with Assistance;and;Home Health Setting;versus;Inpatient Setting(depending on stairs next session)  Equipment Recommendations: Roller Walker.  Patient requires the use of a walker with wheels to complete ADL???s in the home including meal preparation, ambulation the bathroom for toileting, bathing and grooming, and safe home mobility.  Patient is unable to complete these ADL???s with a cane or crutch and can safely use the walker.      Therapist: London Pepper  Date: 03/01/2017

## 2017-03-01 NOTE — Progress Notes
PHYSICAL THERAPY  ASSESSMENT     MOBILITY:  Mobility  Progressive Mobility Level: Walk in hallway  Distance Walked (feet): 50 ft  Level of Assistance: Assist X1  Assistive Device: Walker  Time Tolerated: 11-30 minutes  Activity Limited By: Pain    SUBJECTIVE:  Subjective  Significant hospital events: 5M s/p L THA 12/6  Mental / Cognitive Status: Alert;Oriented;Cooperative  Persons Present: Occupational Therapist(at the end of session. )  Pain: Patient complains of pain;5/10;Before activity;During activity;After activity  Pain Location: Left;Hip;Post-surgical  Pain Description: Aching  Pain Interventions: Patient agrees to participate in therapy with modifications to session;Patient assisted into position of comfort;Nursing staff notified of patient's pain level  L LE Precautions: LLE WBAT: Weight Bearing As Tolerated;L Posterior Hip Precautions  Ambulation Assist: Independent Mobility in Community without Device  Patient Owned Equipment: None  Home Situation: Lives with Family;Has 24/7 Assistance Available(wife and daughter)  Type of Home: House  Entry Stairs: 1-2 Flights of Stairs;Rail on 1 Side(31)  In-Home Stairs: Able to Live on One Level  Comments: Pt reports had a fall when slip on ice about 4 weeks ago.       BED MOBILITY/TRANSFERS:  Bed Mobility/Transfers  Bed Mobility: Supine to Sit: Minimal Assist;Assist with L LE  Transfer Type: Sit to/from Stand  Transfer: Assistance Level: To/From;Bed;Minimal Assist  Transfer: Assistive Device: Nurse, adult  Transfers: Type Of Assistance: Elevated Bed;For Balance;For Safety Considerations;Requires Extra Time  End Of Activity Status: Sitting at Edge of Bed(OT present)      GAIT:  Gait  Gait Distance: 50 feet  Gait: Assistance Level: Minimal Assist  Gait: Assistive Device: Roller Walker  Gait: Descriptors: Pace: Slow;No balance loss  Activity Limited By: Complaint of Pain;Complaint of Fatigue      EDUCATION:  Education  Persons Educated: Patient Patient Barriers To Learning: None Noted  Teaching Methods: Verbal Instruction  Patient Response: Verbalized Understanding;More Instruction Required  Topics: Plan/Goals of PT Interventions;Use of Assistive Device/Orthosis;Mobility Progression;Precautions;Ambulate With Nursing;Recommend Continued Therapy    ASSESSMENT/PROGRESS:  Assessment/Progress  Impaired Mobility Due To: Pain;Post Surgical Changes;Post Surgical Precautions  Assessment/Progress: Should Improve w/ Continued PT  AM-PAC 6 Clicks Basic Mobility Inpatient  Turning from your back to your side while in a flat bed without using bed rails: A Little  Moving from lying on your back to sitting on the side of a flatbed without using bedrails : A Little  Moving to and from a bed to a chair (including a wheelchair): A Little  Standing up from a chair using your arms (e.g. wheelchair, or bedside chair): A Little  To walk in hospital room: A Little  Climbing 3-5 steps with a railing: A Lot  Raw Score: 17  Standardized (T-scale) Score: 39.67  Basic Mobility CMS 0-100%: 43.83  CMS G Code Modifier for Basic Mobility: CK  G-Codes: Mobility  [id number] Current Status:  40-59% Impairment  G8979 Goal Status:  1-19% Impairment    Based on above evaluation and clinical judgment.  GOALS:  Goals  Goal Formulation: With Patient  Time For Goal Achievement: 3 days  Pt Will Go Supine To/From Sit: Independently  Pt Will Transfer Sit to Stand: Independently  Pt Will Ambulate: Greater than 200 Feet, w/ Walker, Independently  Pt Will Go Up / Down Stairs: 1-2 Flights, w/ Minimal Assist    PLAN:  Plan   Treatment Interventions: Development worker, community;Endurance Training;Family Training  Plan Frequency: 1-2 Times per Day  PT Plan for Next Visit: Progress gait distance with roller walker, try  stairs as able.     RECOMMENDATIONS:  PT Discharge Recommendations  PT Discharge Recommendations: Home with Assistance;and;Home Health Setting  Equipment Recommendations: Roller Walker Patient requires the use of a walker with wheels to complete ADL???s in the home including meal preparation, ambulation the bathroom for toileting, bathing and grooming, and safe home mobility.  Patient is unable to complete these ADL???s with a cane or crutch and can safely use the walker.   Therapist: Mikal Plane, PT  Date: 03/01/2017

## 2017-03-01 NOTE — Case Management (ED)
Case Management Progress Note    NAME:Dan Mccarty                          MRN: [id number]              DOB:[birth year]          AGE: 71 y.o.  ADMISSION DATE: 02/28/2017             DAYS ADMITTED: LOS: 1 day      Today???s Date: 03/01/2017    Plan  [facility] [facility] and RW on DC  Interventions  ? Support      ? Info or Referral      ? Discharge Planning   Discharge Planning: Home Health, Durable Medical Equipment and Supplies-NCM spoke with patient this am. Pt would like HH on DC. Pt has no preference for [facility] agency. NCM called/faxed referral with orders to [facility]. NCM will place pt on weekend report for NCM to fax AVS on day of DC. Pt also needs RW on DC. NCM tasked CMA to deliver to bedside prior to DC.  ? Medication Needs      ? Financial      ? Legal      ? Other        Disposition  ? Expected Discharge Date    Expected Discharge Date: 03/02/17  ? Transportation   Does the patient need discharge transport arranged?: No  Transportation Name, Phone and Availability #1: Son  Does the patient use Medicaid Transportation?: No  ? Next Level of Care (Acute Psych discharges only)      ? Discharge Disposition                                          Durable Medical Equipment - Selection Complete      Service Provider Request Status Selected Services Address Phone Number Fax Number    [facility] Selected Durable Medical Equipment [address], [place] [address] [phone]      [place] Destination      No service has been selected for the patient.      [facility] - Selection Complete      Service Provider Request Status Selected Services Address Phone Number Fax Number    [facility] AND [facility] [address], [place] [address] [phone]      [facility]      No service has been selected for the patient.          Case Management Admission Assessment    NAME:Dan Mccarty                          MRN: [id number] DOB:[birth year]          AGE: 71 y.o.  ADMISSION DATE: 02/28/2017             DAYS ADMITTED: LOS: 1 day      Today???s Date: 03/01/2017    Source of Information: Patient      Plan  Plan: Case Management Assessment, Assist PRN with SW/NCM Services, Discharge Planning for Home Anticipated    Patient Address/Phone  [address]  [place] [address]  [phone] (home)     Emergency Contact  Extended Emergency  Contact Information  Primary Emergency Contact: [place]  Address: [address]           [place], [place] [address] [employer]  Home Phone: [phone]  Mobile Phone: [phone]  Relation: Spouse  Secondary Emergency Contact: Ethel [place]  Home Phone: [phone]  Mobile Phone: [phone]  Relation: Son    Network engineer Directive: Yes, patient has a Geophysical data processor: Patient does not have it with him/her  Would patient like to fill out a (a new) Editor, commissioning?: N/A  Psych Advance Directive (Psych unit only): No, patient does not have a Social research officer, government  Does the patient need discharge transport arranged?: No  Transportation Name, Phone and Availability #1: Son  Does the patient use Medicaid Transportation?: No    Expected Discharge Date  Expected Discharge Date: 03/02/17    Living Situation Prior to Admission  ? Living Arrangements  Type of Residence: Home, independent  Living Arrangements: Spouse/significant other  Financial risk analyst / Tub: Psychologist, counselling  How many levels in the residence?: 1  Can patient live on one level if needed?: Yes  Does residence have entry and/or side stairs?: Yes(31 stairs to enter home, bed/bath main level once inside house)  Assistance needed prior to admit or anticipated on discharge: No  Who provides assistance or could if needed?: Spouse  Are they in good health?: Unknown  Can support system provide 24/7 care if needed?: Maybe  ? Level of Function Prior level of function: Independent  ? Cognitive Abilities   Cognitive Abilities: Alert and Oriented    Financial Resources  ? Coverage  Primary Insurance: Medicare  Secondary Insurance: Medicare Supplement  Additional Coverage: RX([employer] and [employer]-prescriptions affordable)    ? Source of Income   Source Of Income: SSI  ? Financial Assistance Needed?      Psychosocial Needs  ? Mental Health  Mental Health History: No  ? Substance Use History  Substance Use History Screen: No  ? Other      Current/Previous Services  ? PCP  Steva Ready, [phone], [phone]  ? Pharmacy    [facility] [address], [place] [address]  [place] [address]  Phone: [phone] Fax: [phone]    [facility] - [place], [place] - [address] [facility]  [address] [place]  [address]  [place] [address]  Phone: [phone] Fax: [phone]    ? Durable Medical Equipment   Durable Medical Equipment at home: None  ? Home Health  Receiving home health: No  ? Hemodialysis or Peritoneal Dialysis  Undergoing hemodialysis or peritoneal dialysis: No  ? Tube/Enteral Feeds  Receive tube/enteral feeds: No  ? Infusion  Receive infusions: No  ? Private Duty  Private duty help used: No  ? Home and Community Based Services  Home and community based services: No  ? [employer] Supply: N/A  ? Hospice  Hospice: No  ? Outpatient Therapy  PT: No  OT: No  SLP: No  ? Skilled Nursing Facility/Nursing Home  SNF: No  NH: No  ? Inpatient Rehab  IPR: No  ? Long-Term Acute Care Hospital  LTACH: No  ? Acute Hospital Stay  Acute Hospital Stay: In the past  Was patient's stay within the last 30 days?: No    Leretha Dykes, BSN, RN  Ortho Case Manager  [phone]

## 2017-03-01 NOTE — Progress Notes
RT Adult Assessment Note    NAME:Dan Jerilynn MagesD Criger             MRN: [id number]             DOB:June 04, 1945          AGE: 71 y.o.  ADMISSION DATE: 02/28/2017             DAYS ADMITTED: LOS: 0 days    RT Treatment Plan:       Protocol Plan: Procedures  IPPB: Place a nursing order for "IS Q1h While Awake" for any of Lung Expansion indicators  Oxygen/Humidity: O2 to keep SpO2 > 92%  Monitoring: Pulse oximetry BID & PRN    Additional Comments:  Impressions of the patient:Patient sitting in bed, alert & oriented  Intervention(s)/outcome(s): oxygen titrated to 1 lpm  Patient education that was completed: OSA education  Recommendations to the care team: Patient placed on OSA protocol    Vital Signs:  Pulse: Pulse: 89  RR: Respirations: 16 PER MINUTE  SpO2: SpO2: 98 %  O2 Device: $$ O2 Device: Cannula  Liter Flow: O2 Liter Flow: (S) 1 lpm  O2%:    Breath Sounds: All Breath Sounds: Clear (implies normal)  Respiratory Effort: Respiratory Effort: Non-Labored

## 2017-03-02 LAB — BASIC METABOLIC PANEL: Lab: 135 MMOL/L — ABNORMAL LOW (ref 137–147)

## 2017-03-02 LAB — CBC: Lab: 11 K/UL — ABNORMAL HIGH (ref 4.5–11.0)

## 2017-03-02 LAB — POC GLUCOSE
Lab: 169 mg/dL — ABNORMAL HIGH (ref 70–100)
Lab: 183 mg/dL — ABNORMAL HIGH (ref 70–100)
Lab: 121 mg/dL — ABNORMAL HIGH (ref 70–100)
Lab: 136 mg/dL — ABNORMAL HIGH (ref 70–100)

## 2017-03-02 NOTE — Progress Notes
Orthopedic Surgery Progress Note    S: No acute events.  Pain control imprvoed  Patient tolerating current diet.      O:    Blood pressure 120/70, pulse 71, temperature 36.6 C (97.9 F), height 182.9 cm (72"), weight 104.8 kg (231 lb), SpO2 96 %.     GEN: A&O, NAD  CV: Normal rate, normal rhythm  PULM: Non-labored  ABD: Soft, NT, ND  EXTREM: LLE f/e great toe and ankle, SILT to foot, dressing c/d/i    DRAINS (last 2 shifts): n/a mL    Complete Blood Counts   Recent Labs      03/01/17   0455  03/02/17   0423   HGB  11.1*  10.1*   HCT  33.1*  29.7*   WBC  12.7*  11.3*   PLTCT  255  190        Chemistry Panel   Recent Labs      03/01/17   0455  03/02/17   0423   NA  135*  135*   K  4.2  3.9   CL  103  102   CO2  24  29   BUN  27*  20   CR  1.14  0.98   GLU  151*  141*   CA  9.0  8.5        A: 18M s/p L THA    P:   -PT/OT: WBAT, PHP  -Diet: regular  -Continue current pain Rx  -Lytes replaced PRN  -Acute blood loss anemia: Monitor CBC's daily.     -PPx: Bowel regimen, RT protocol, ASA, SCD's    DISPOSITION: HOme today    Alden HippGrote  2244

## 2017-03-02 NOTE — Progress Notes
PHYSICAL THERAPY  PROGRESS NOTE       MOBILITY:  Mobility  Progressive Mobility Level: Walk in hallway  Distance Walked (feet): 100 ft  Level of Assistance: Assist X1  Assistive Device: Walker  Time Tolerated: 11-30 minutes  Activity Limited By: Pain    SUBJECTIVE:  Subjective  Significant hospital events: 71M s/p L THA 12/6  Mental / Cognitive Status: Alert;Oriented;Cooperative  Persons Present: Nursing Staff  Pain: Patient complains of pain;Patient does not rate pain  Pain Location: Left;Hip;Post-surgical  Pain Interventions: Patient agrees to participate in therapy with modifications to session;Patient pre-medicated;Treatment altered to patient's pain tolerance  L LE Precautions: LLE WBAT: Weight Bearing As Tolerated;L Posterior Hip Precautions  Ambulation Assist: Independent Mobility in Community without Device  Patient Owned Equipment: None  Home Situation: Lives with Family;Has 24/7 Assistance Available(wife and daughter)  Type of Home: House  Entry Stairs: 1-2 Flights of Stairs;Rail on 1 Side(31 in through front, 2 in through back)  In-Home Stairs: Able to Live on One Level         BED MOBILITY/TRANSFERS:  Bed Mobility/Transfers  Bed Mobility: Supine to Sit: Minimal Assist  Bed Mobility: Sit to Supine: Minimal Assist  Transfer Type: Sit to/from Stand  Transfer: Assistance Level: Bed;To/From;Toilet;Minimal Assist  Transfer: Assistive Device: Nurse, adult  Transfers: Type Of Assistance: Elevated Bed;For Balance;For Safety Considerations;Requires Extra Time  End Of Activity Status: In Bed           GAIT:  Gait  Gait Distance: 100 feet  Gait: Assistance Level: Minimal Assist  Gait: Assistive Device: Roller Walker  Gait: Descriptors: Pace: Slow  Stairs: Number Climbed: 10  Stairs: Descriptors: Ascend;Descend;Non-Reciprocal  Stairs: Assistance Level: Ascend;Descend;Minimal Assist  Stairs: Assistive Device: Two Rails  Activity Limited By: Complaint of Pain;Complaint of Fatigue         ASSESSMENT/PROGRESS: Assessment/Progress  Impaired Mobility Due To: Pain;Post Surgical Changes;Post Surgical Precautions  Assessment/Progress: Should Improve w/ Continued PT  AM-PAC 6 Clicks Basic Mobility Inpatient  Turning from your back to your side while in a flat bed without using bed rails: A Little  Moving from lying on your back to sitting on the side of a flatbed without using bedrails : A Little  Moving to and from a bed to a chair (including a wheelchair): A Little  Standing up from a chair using your arms (e.g. wheelchair, or bedside chair): A Little  To walk in hospital room: A Little  Climbing 3-5 steps with a railing: A Little  Raw Score: 18  Standardized (T-scale) Score: 41.05  Basic Mobility CMS 0-100%: 40.47  CMS G Code Modifier for Basic Mobility: CK    GOALS:  Goals  Goal Formulation: With Patient  Time For Goal Achievement: 3 days  Pt Will Go Supine To/From Sit: Independently  Pt Will Transfer Sit to Stand: Independently  Pt Will Ambulate: Greater than 200 Feet, w/ Walker, Independently  Pt Will Go Up / Down Stairs: 1-2 Flights, w/ Minimal Assist    PLAN:  Plan   Treatment Interventions: Development worker, community;Endurance Training;Family Training  Plan Frequency: 1-2 Times per Day  PT Plan for Next Visit: Trial stairs next session, increase ambulation distance, review posterior hip precautions and home exercise program.    RECOMMENDATIONS:  PT Discharge Recommendations  PT Discharge Recommendations: Home with Assistance  Equipment Recommendations: Roller Walker    Therapist: Era Bumpers, PT  Date: 03/02/2017

## 2017-03-02 NOTE — Case Management (ED)
Case Management Progress Note    NAME:Dan Mccarty                          MRN: [id number]              DOB:[birth year]          AGE: 71 y.o.  ADMISSION DATE: 02/28/2017             DAYS ADMITTED: LOS: 2 days      Todays Date: 03/02/2017    Plan  Discharge home today with home health.     Interventions  ? Support      ? Info or Referral      ? Discharge Planning   Discharge Planning: Home Health, Durable Medical Equipment and Supplies    -[facility]  ? Medication Needs      ? Financial      ? Legal      ? Other        Disposition  ? Expected Discharge Date    Expected Discharge Date: 03/02/17  ? Transportation   Does the patient need discharge transport arranged?: No  Transportation Name, Phone and Availability #1: Son  Does the patient use Medicaid Transportation?: No  ? Next Level of Care (Acute Psych discharges only)      ? Discharge Disposition          Durable Medical Equipment - Selection Complete      Service Provider Request Status Selected Services Address Phone Number Fax Number    [facility] [place] Selected Durable Medical Equipment [address], [place] [id number] [phone]      [place] Destination      No service has been selected for the patient.      [facility] - Selection Complete      Service Provider Request Status Selected Services Address Phone Number Fax Number    [facility] Services [address], [place] [id number] [phone]      [facility]      No service has been selected for the patient.          Carroll SageLizzy Castin Donaghue, BSN, RN  Ortho Nurse Case Manager  [phone]  Pager [phone]

## 2017-03-03 ENCOUNTER — Inpatient Hospital Stay: Admit: 2017-02-28 | Discharge: 2017-02-28 | Payer: MEDICARE

## 2017-03-03 ENCOUNTER — Inpatient Hospital Stay: Admit: 2017-02-28 | Discharge: 2017-03-03 | Disposition: A | Discharge status: Home Health | Payer: MEDICARE

## 2017-03-03 ENCOUNTER — Encounter: Admit: 2017-03-03 | Discharge: 2017-03-03 | Payer: MEDICARE

## 2017-03-03 DIAGNOSIS — Z6831 Body mass index (BMI) 31.0-31.9, adult: ICD-10-CM

## 2017-03-03 DIAGNOSIS — E1122 Type 2 diabetes mellitus with diabetic chronic kidney disease: ICD-10-CM

## 2017-03-03 DIAGNOSIS — Z87891 Personal history of nicotine dependence: ICD-10-CM

## 2017-03-03 DIAGNOSIS — E669 Obesity, unspecified: ICD-10-CM

## 2017-03-03 DIAGNOSIS — E785 Hyperlipidemia, unspecified: ICD-10-CM

## 2017-03-03 DIAGNOSIS — F419 Anxiety disorder, unspecified: ICD-10-CM

## 2017-03-03 DIAGNOSIS — M25552 Pain in left hip: ICD-10-CM

## 2017-03-03 DIAGNOSIS — M1612 Unilateral primary osteoarthritis, left hip: Principal | ICD-10-CM

## 2017-03-03 DIAGNOSIS — F329 Major depressive disorder, single episode, unspecified: ICD-10-CM

## 2017-03-03 DIAGNOSIS — N183 Chronic kidney disease, stage 3 (moderate): ICD-10-CM

## 2017-03-03 DIAGNOSIS — Z9049 Acquired absence of other specified parts of digestive tract: ICD-10-CM

## 2017-03-03 DIAGNOSIS — Z7984 Long term (current) use of oral hypoglycemic drugs: ICD-10-CM

## 2017-03-03 DIAGNOSIS — I129 Hypertensive chronic kidney disease with stage 1 through stage 4 chronic kidney disease, or unspecified chronic kidney disease: ICD-10-CM

## 2017-03-03 DIAGNOSIS — I4891 Unspecified atrial fibrillation: ICD-10-CM

## 2017-03-03 LAB — POC GLUCOSE
Lab: 122 mg/dL — ABNORMAL HIGH (ref 70–100)
Lab: 183 mg/dL — ABNORMAL HIGH (ref 70–100)

## 2017-03-03 LAB — CBC: Lab: 9.9 10*3/uL — ABNORMAL HIGH (ref 4.5–11.0)

## 2017-03-03 LAB — BASIC METABOLIC PANEL: Lab: 136 MMOL/L — ABNORMAL LOW (ref >60)

## 2017-03-03 NOTE — Progress Notes
PHYSICAL THERAPY  PROGRESS NOTE       MOBILITY:  Mobility  Progressive Mobility Level: Walk in hallway  Distance Walked (feet): 30 ft  Level of Assistance: Stand by assistance  Assistive Device: Walker  Time Tolerated: 11-30 minutes  Activity Limited By: Pain    SUBJECTIVE:  Subjective  Significant hospital events: Dan Mccarty s/p L THA 12/6  Mental / Cognitive Status: Alert;Oriented;Cooperative  Persons Present: Nursing Staff  Pain: Patient complains of pain;3/10;4/10;Before activity;During activity;After activity  Pain Location: Left;Hip;Post-surgical  Pain Description: Aching  Pain Interventions: Patient pre-medicated;Patient agrees to participate in therapy with modifications to session;Patient assisted into position of comfort  L LE Precautions: LLE WBAT: Weight Bearing As Tolerated;L Posterior Hip Precautions  Ambulation Assist: Independent Mobility in Community without Device  Patient Owned Equipment: None  Home Situation: Lives with Family;Has 24/7 Assistance Available(wife and daughter)  Type of Home: House  Entry Stairs: 1-2 Flights of Stairs;Rail on 1 Side(31 in through front, 2 in through back)  In-Home Stairs: Able to Live on One Level      BED MOBILITY/TRANSFERS:  Bed Mobility/Transfers  Bed Mobility: Supine to Sit: Standby Assist;Head of Bed Elevated;No Rail;Requires Extra Time  Bed Mobility: Sit to Supine: Moderate Assist;Assist with B LE;Bed Flat;No Rail  Transfer Type: Sit to/from Stand  Transfer: Assistance Level: To/From;Bed;Standby Assist  Transfer: Assistive Device: Nurse, adult  Transfers: Type Of Assistance: Elevated Bed;For Balance;For Safety Considerations;Requires Extra Time  End Of Activity Status: In Bed;Nursing Notified;Instructed Patient to Request Assist with Mobility;Instructed Patient to Use Call Light    GAIT:  Gait  Gait Distance: 30 feet  Gait: Assistance Level: Standby Assist  Gait: Assistive Device: Nurse, adult  Gait: Descriptors: Pace: Slow;Forward trunk flexion;No balance loss Stairs: Number Climbed: (5 stairs with 1 rail, and 2 stoop steps with roller walker)  Stairs: Descriptors: Ascend;Descend;Non-Reciprocal  Stairs: Assistance Level: Ascend;Descend;Minimal Assist  Activity Limited By: Complaint of Pain;Complaint of Fatigue  Comments: Pt reports could go back door with 2 stoop step enter. Pt instructed how to use roller walker for stoop step negotiation.       EDUCATION:  Education  Persons Educated: Patient  Patient Barriers To Learning: None Noted  Teaching Methods: Verbal Instruction  Patient Response: Verbalized Understanding;More Instruction Required  Topics: Plan/Goals of PT Interventions;Use of Assistive Device/Orthosis;Mobility Progression;Precautions;Ambulate With Nursing;Recommend Continued Therapy    ASSESSMENT/PROGRESS:  Assessment/Progress  Impaired Mobility Due To: Pain;Post Surgical Changes;Post Surgical Precautions  Assessment/Progress: Should Improve w/ Continued PT  AM-PAC 6 Clicks Basic Mobility Inpatient  Turning from your back to your side while in a flat bed without using bed rails: None  Moving from lying on your back to sitting on the side of a flatbed without using bedrails : A Little  Moving to and from a bed to a chair (including a wheelchair): A Little  Standing up from a chair using your arms (e.g. wheelchair, or bedside chair): A Little  To walk in hospital room: A Little  Climbing 3-5 steps with a railing: A Little  Raw Score: 19  Standardized (T-scale) Score: 42.48  Basic Mobility CMS 0-100%: 36.99  CMS G Code Modifier for Basic Mobility: CJ    GOALS:  Goals  Goal Formulation: With Patient  Time For Goal Achievement: 3 days  Pt Will Go Supine To/From Sit: Independently  Pt Will Transfer Sit to Stand: Independently  Pt Will Ambulate: Greater than 200 Feet, w/ Walker, Independently  Pt Will Go Up / Down Stairs: 1-2 Flights, w/ Minimal Assist  PLAN:  Plan   Treatment Interventions: Mobility Training;Endurance Training;Family Training Plan Frequency: 1-2 Times per Day  PT Plan for Next Visit: Progress bed mobility, transfer, gait distance with roller walker, stairs.     RECOMMENDATIONS:  PT Discharge Recommendations  PT Discharge Recommendations: Home with consistent supervision;and;Home Health Setting  Equipment Recommendations: Roller Walker  Recommend ongoing assistance for: In and out of house;Transfers;Bed mobility;Ambulation;Stairs    Therapist: Mikal Plane, PT  Date: 03/03/2017

## 2017-03-03 NOTE — Progress Notes
Kieth BrightlyGarry D Stege discharged on 03/03/2017.   [name].  Discharge instructions reviewed with patient.  Valuables returned:   Personal Items / Valuables: Cell Phone, Eyeglasses/Contacts, Clothing.  Home medications:    .  Functional assessment at discharge complete: Yes .

## 2017-03-03 NOTE — Progress Notes
Orthopedic Surgery Progress Note    S: No acute events.  Pain controlled. Patient tolerating current diet.  Long discussion w/patient this am and his discharge plan. Patient feels safe to discharge today and has adequate support at home    O:    Blood pressure 120/66, pulse 71, temperature 36.5 C (97.7 F), height 182.9 cm (72"), weight 104.8 kg (231 lb), SpO2 96 %.     GEN: A&O, NAD  CV: Normal rate, normal rhythm  PULM: Non-labored  ABD: Soft, NT, ND  EXTREM: LLE f/e great toe and ankle, SILT to foot, dressing c/d/i    DRAINS (last 2 shifts): n/a mL    Complete Blood Counts   Recent Labs      03/01/17   0455  03/02/17   0423  03/03/17   0420   HGB  11.1*  10.1*  10.3*   HCT  33.1*  29.7*  31.1*   WBC  12.7*  11.3*  9.9   PLTCT  255  190  218        Chemistry Panel   Recent Labs      03/01/17   0455  03/02/17   0423  03/03/17   0420   NA  135*  135*  136*   K  4.2  3.9  4.0   CL  103  102  102   CO2  24  29  27    BUN  27*  20  20   CR  1.14  0.98  0.79   GLU  151*  141*  139*   CA  9.0  8.5  8.9        A: 87M s/p L THA 12/6    P:   -PT/OT: WBAT, PHP  -Diet: regular  -Continue current pain Rx  -Lytes replaced PRN  -Acute blood loss anemia: Monitor CBC's daily.     -PPx: Bowel regimen, RT protocol, ASA, SCD's    DISPOSITION: Discharge HOme today    Alden HippGrote  2244

## 2017-03-04 ENCOUNTER — Encounter: Admit: 2017-03-04 | Discharge: 2017-03-04 | Payer: MEDICARE

## 2017-03-04 DIAGNOSIS — E785 Hyperlipidemia, unspecified: ICD-10-CM

## 2017-03-04 DIAGNOSIS — M1990 Unspecified osteoarthritis, unspecified site: ICD-10-CM

## 2017-03-04 DIAGNOSIS — F419 Anxiety disorder, unspecified: ICD-10-CM

## 2017-03-04 DIAGNOSIS — Z8669 Personal history of other diseases of the nervous system and sense organs: ICD-10-CM

## 2017-03-04 DIAGNOSIS — F329 Major depressive disorder, single episode, unspecified: ICD-10-CM

## 2017-03-04 DIAGNOSIS — E119 Type 2 diabetes mellitus without complications: ICD-10-CM

## 2017-03-04 DIAGNOSIS — I10 Essential (primary) hypertension: Secondary | ICD-10-CM

## 2017-03-04 DIAGNOSIS — I4892 Unspecified atrial flutter: ICD-10-CM

## 2017-03-04 DIAGNOSIS — I4891 Unspecified atrial fibrillation: ICD-10-CM

## 2017-03-04 DIAGNOSIS — N189 Chronic kidney disease, unspecified: ICD-10-CM

## 2017-03-04 DIAGNOSIS — R079 Chest pain, unspecified: ICD-10-CM

## 2017-03-04 DIAGNOSIS — E669 Obesity, unspecified: ICD-10-CM

## 2017-03-04 DIAGNOSIS — Z9889 Other specified postprocedural states: ICD-10-CM

## 2017-03-04 NOTE — Discharge Instructions - Pharmacy
Physician Discharge Summary      Name: Dan Mccarty  Medical Record Number: [id number]        Account Number:  [id number]  Date Of Birth:  Aug 05, 1945                         Age:  71 years   Admit date:  02/28/2017                     Discharge date:  03/03/2017    Attending Physician:  Dr. [name]               Service: Malachi Bonds    Physician Summary completed by: Casey Burkitt, MD    Reason for hospitalization: LEFT TOTAL HIP ARTHROPLASTY (Left)      Significant PMH:   Past Medical History:   Diagnosis Date   ??? Anxiety 09/12/2010   ??? Arthritis     lower back   ??? Atrial fibrillation (HCC) 09/26/2010    s/p atrial ablation surgery 2012   ??? Atrial flutter (HCC) 02/22/2011    s/p atrial ablation surgery 2012   ??? Chest pain    ??? Chest pain 09/20/2006    09/19/06 Heart Cath:  1. Normal left ventricular size, systolic function and hemodynamics.  2. Anterolateral hypokinesis.  3. 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 Chronic kidney disease     stage 3   ??? Depression 09/12/2010   ??? Diabetes (HCC) 09/12/2010   ??? Diabetes mellitus type II 09/12/2010   ??? HTN (hypertension)    ??? Hx of sleep apnea     does not use CPAP after 70 lb weight loss and sinus issues   ??? Hyperlipidemia 09/20/2006   ??? Hypertension 09/20/2006   ??? Obesity 09/12/2010   ??? S/P ablation of atrial flutter 01/29/2013    2012 by Dr. Wallene Huh         Allergies: Adhesive tape (rosins) and Nsaids (non-steroidal anti-inflammatory drug)    Admission Physical Exam notable for:    General: Alert, NAD  ENT: Oropharynx/Nasopharynx clear  Respiratory: Unlabored respirations  Cardio: RRR  GI: soft, NT, ND  Skin: Normal turgor  Musculoskeletal:???Musculoskeletal: Exam of Left Lower Extremity  ???????????????????????????????????????- Hip ROM 0-110, obligate ER, pain with IR  ???????????????????????????????????????- Stinchfield positive  ???????????????????????????????????????- Strength: ???Normal strength in hip flexion, knee extension, ankle dorsiflexion ???????????????????????????????????????- Palpation: No tenderness to palpation over the greater trochanter  ???????????????????????????????????????- Dorsalis pedis 2+ pulse, foot warm and well perfused.  Neurologic: Grossly intact, moving all extremities spontaneously          Admission Lab/Radiology studies notable for:   HIP 2-3 VIEWS W PELVIS LT   Final Result   Findings/impression:   1. There has been a left total hip arthroplasty. There is good alignment. No periprosthetic fracture identified. Expected soft tissue air is noted about the left hip.      2. Degenerative change of the left SI joint. Intact bilateral SI joints and symphysis pubis. Severe degenerative changes of the right hip are again noted.          Finalized by Shanna Cisco, M.D. on 02/28/2017 5:20 PM. Dictated by Shanna Cisco, M.D. on 02/28/2017 5:19 PM.               Brief Hospital Course:    The patient was admitted following the below procedure which was well tolerated. The hospital  course was uneventful. The patient progressed well with PT/OT, his pain was controlled on PO pain medications, and tolerated a regular diet. he was discharged home to follow up in clinic.     Condition at Discharge: Stable    Discharge Diagnoses:      Hospital Problems        Active Problems    Hip osteoarthritis          Surgical Procedures:     LEFT TOTAL HIP ARTHROPLASTY (Left)        Significant Diagnostic Studies and Procedures: none    Consults:  None    Patient Disposition: Home       Patient instructions/medications:      Activity as Tolerated    It is important to keep increasing your activity level after you leave the hospital.  Moving around can help prevent blood clots, lung infection (pneumonia) and other problems.  Gradually increasing the number of times you are up moving around will help you return to your normal activity level more quickly.  Continue to increase the number of times you are up to the chair and walking daily to return to your normal activity level. - Increase activity as tolerated while maintaining your restrictions until further instructions at your follow up appointment  - Increase walking as able and avoid sitting/standing/laying in one position for long periods of time  - Perform exercises as instructed by physical therapy 2-3 times a day     Bathing Restrictions    For dermabond/tegaderm dressing:     Your incision has been sealed with Dermabond (a glue that acts to prevent moisture from contacting the incision) and a sterile water-resistant dressing.  You may shower whenever you like after surgery.  Both of these layers will protect your incision from the water in a shower.  Do not get in a bath, pool, or submerge the incision in any way.  The Dermabond and plastic dressing are water-resistant, not water-proof.     Driving Restrictions    No driving while taking pain medication and until physician approval at follow-up appointment.     Restrictions for Left Leg    Posterior Hip Precautions  Be sure to maintain your posterior hip precaution.  Key points to remember:  *Do not bend your hip past 90 degrees.  *Use your reacher to pick up items off the floor.  *Make sure your chair and toilet are not too low. Use an extra cushion for a chair or riser for the      toilet.  *Do not cross your legs.  *Do not rotate the operative leg inward in extreme positions.      Weight bearing: You may decide how much weight to put on your leg.  If you feel pain, decrease the amount of weight you are putting on your leg.  Continue these restrictions until follow up appointment.     Regular Diet    You have no dietary restriction. Please continue with a healthy balanced diet.    While taking pain medications:  - Drink plenty of fluids (not including alcohol)  - Add extra fiber to your diet  - Continue your stool softeners to help prevent constipation     Incision Care    For Tegaderm:      Your surgical dressing should remain in place for 5 days before it can be removed.  At that point you do not need a dressing, but some patients prefer to have one to  prevent stitches or staples from rubbing on clothing.     Report These Signs and Symptoms    Contact your doctor if you have any of the following symptoms:    *temperature higher than 100 degrees  *uncontrolled pain  *persistenet nausea and/or vomiting  *calf pain or tenderness  *unable to urinate  *unable to have a bowel movement  *continued or increased drainage    Ongoing swelling of your surgical leg can occur for 3 to 6 months. Bruising is very common as well but should decrease within a few weeks.    Call 911 if you experience difficulty breathing, chest pain, or severe calf pain or swelling.     Return Appointment    Please call [phone] to schedule follow up with Dr. Opal Sidles in 1-2 weeks if you do not already have an appointment.     [facility] Provider Sula Rumple [[id number]]      Orthopedics Important Reminders    Prevention of Blood Clots:    You have been provided with a prescription to prevent blood clots.  Take this medication as directed.        ICE PACKS  You may use an ice pack to your leg to help with pain and swelling.  Do not put the ice pack directly on the incision or bandage.  Place ice packs on either side of the incision.  Only leave ice on for 15 to 20 minutes 3 to 4 times a day. Leaving ice on for longer than 20 minutes may cause damage to your skin.  Do not get your incision wet.    Homemade ice pack: mix 2 cups water with 1 cup rubbing alcohol and put in a plastic resealable bag and put in freezer.    DENTAL WORK  Do not have dental work done for the first 3 months after your surgery.  You will need to take antibiotics before going to the dentist to prevent infection of your new joint. Contact your doctor's office at least one week before your dental appointment for a prescription.    TRAVEL  Do not travel long distances or on an airplane unless you have discussed it with your surgeon. Questions About Your Stay    For questions or concerns regarding your hospital stay:     DURING BUSINESS HOURS (8:00 AM - 4:30 PM)   Call the Dr. Gabriela Eves clinic at [phone]     AFTER BUSINESS HOURS AND WEEKENDS   Call [phone] and ask the operator to page the on-call Orthopedic Resident for emergencies, otherwise leave a voicemail for Dr. Gabriela Eves nurse.     Discharging attending physician: Sula Rumple [[id number]]      Opioid (Narcotic) Safety Information    OPIOID (NARCOTIC) PAIN MEDICATION SAFETY    We care about your comfort, and believe you need opioid medications at this time to treat your pain.  An opioid is a strong pain medication.  It is only available by prescription for moderate to severe pain.  Usually these medications are used for only a short time to treat pain, but sometimes will be prescribed for longer.  Talk with your doctor or nurse about how long they expect you to need this medication.    When used the right way, opioids are safe and effective medications to treat your pain, even when used for a long time.  Yet, when used in the wrong way, opioids can be dangerous for you or others.  Opioids do not work for  everyone.  Most patients do not get full relief of their pain from opioid medication; full relief of your pain may not be possible.     For your safety, we ask you to follow these instructions:    *Only take your opioid medication as prescribed.  If your pain is not controlled with the prescribed dose, or the medication is not lasting long enough, call your doctor.  *Do not break or crush your opioid medication unless your doctor or pharmacist says you can.  With certain medications, this can be dangerous, and may cause death.  *Never share your medications with others, even if they appear to have a good reason.  Never take someone else's pain medication-this is dangerous, and illegal (a crime).  Overdoses and deaths have occurred. *Keep your opioid medications safe, as you would with cash, in a lock box or similar container.  *Make sure your opioids are going to be secure, especially if you are around children or teens.  *Talk with your doctor or pharmacist before you take other medications.  *Avoid driving, operating machinery, or drinking alcohol while taking opioid pain medication.  This may be unsafe.    Pain medications can cause constipation. Constipation is bowel movements that are less often than normal. Stools often become very hard and difficult to pass. This may lead to stomach pain and bloating. It may also cause pain when trying to use the bathroom. Constipation may be treated with suppositories, laxatives or stool softeners. A diet high in fiber with plenty of fluids helps to maintain regular, soft bowel movements.      Current Discharge Medication List       START taking these medications    Details   docusate (COLACE) 100 mg capsule Take one capsule by mouth twice daily as needed for Constipation.  Qty: 180 capsule, Refills: 3    PRESCRIPTION TYPE:  OTC      oxyCODONE (ROXICODONE, OXY-IR) 5 mg tablet Take one tablet to three tablets by mouth every 6 hours as needed  Qty: 50 tablet, Refills: 0    PRESCRIPTION TYPE:  Print          CONTINUE these medications which have been CHANGED or REFILLED    Details   aspirin EC 81 mg tablet Take one tablet by mouth daily with breakfast. Take with food.  Qty: 90 tablet, Refills: 3    PRESCRIPTION TYPE:  Normal          CONTINUE these medications which have NOT CHANGED    Details   acetaminophen (TYLENOL) 500 mg tablet Take 1,000 mg by mouth every 6 hours as needed for Pain. Max of 4,000 mg of acetaminophen in 24 hours.    PRESCRIPTION TYPE:  Historical Med      atorvastatin (LIPITOR) 40 mg tablet TAKE 1 TABLET BY MOUTH  DAILY  Qty: 90 tablet, Refills: 1    PRESCRIPTION TYPE:  Normal      carvedilol (COREG) 3.125 mg tablet Take 3.125 mg by mouth twice daily with meals. Take with food. PRESCRIPTION TYPE:  Historical Med      citalopram (CELEXA) 20 mg tablet Take 20 mg by mouth every morning.    PRESCRIPTION TYPE:  Historical Med      hydroCHLOROthiazide (HYDRODIURIL) 25 mg tablet TAKE 1 TABLET BY MOUTH  EVERY MORNING  Qty: 90 tablet, Refills: 3    PRESCRIPTION TYPE:  Normal      losartan(+) (COZAAR) 100 mg tablet TAKE 1 TABLET BY MOUTH  DAILY  Qty: 90 tablet, Refills: 3    PRESCRIPTION TYPE:  Normal      metFORMIN (GLUCOPHAGE) 1,000 mg tablet Take 1 Tab by mouth twice daily.    PRESCRIPTION TYPE:  Historical Med      nitroglycerin (NITROSTAT) 0.4 mg tablet 1 Tab as Needed for Chest Pain.  Qty: 25 Tab, Refills: 1    PRESCRIPTION TYPE:  Normal      OMEGA-3S-DHA-EPA-FISH OIL PO Take 1,400 mg by mouth twice daily. 2 caps in the am, 1 cap in the pm     PRESCRIPTION TYPE:  Historical Med      other medication Equate No Drip - 1-2 sprays into both nostril daily    PRESCRIPTION TYPE:  Historical Med      potassium chloride SR (KLOR-CON M20) 20 mEq tablet Take 20 mEq by mouth daily with breakfast.    PRESCRIPTION TYPE:  Historical Med      vitamins, multiple Cap Take 1 capsule by mouth daily with breakfast.    PRESCRIPTION TYPE:  Historical Med              Scheduled appointments:    Apr 01, 2017  1:20 PM CST  Post - Op with Sula Rumple, MD  [facility]) Medical Office [place] [address]  [address]  [place] [address]  [phone]   Apr 16, 2017 11:15 AM CST  Return Patient with Hester Mates, MD  Cardiovascular Medicine at [facility] [place]) [address]  [address]  [place] [address]  [phone]          Pending items needing follow up: none  Signed:  Casey Burkitt, MD  03/04/2017      cc:  Primary Care Physician:  Steva Ready     Referring physicians:  Remonia Richter, MD   Additional provider(s):

## 2017-03-05 NOTE — Anesthesia Post-Procedure Evaluation
Post-Anesthesia Evaluation    Name: Dan Mccarty      MRN: [id number]     DOB: 1945-12-11     Age: 71 y.o.     Sex: male   __________________________________________________________________________     Procedure Date: 02/21/2017  Procedure: * No procedures listed *      Surgeon: * No surgeons listed *    Post-Anesthesia Vitals         Post Anesthesia Evaluation Note    Evaluation location: Pre/Post  Patient participation: recovered; patient participated in evaluation  Level of consciousness: alert    Pain score: 4  Pain management: adequate    Hydration: normovolemia  Temperature: 36.0C - 38.4C  Airway patency: adequate    Perioperative Events  Perioperative events:  no       Post-op nausea and vomiting: no PONV    Postoperative Status  Cardiovascular status: hemodynamically stable  Respiratory status: spontaneous ventilation  Follow-up needed: none        Perioperative Events

## 2017-03-05 NOTE — Operative Report(Direct Entry)
OPERATIVE REPORT    Name: Dan Mccarty is a 71 y.o. male     DOB: [birth year]             MRN#: [id number]    DATE OF OPERATION: 02/28/2017    Surgeon(s) and Role:     Sula Rumple, MD - Primary     Casey Burkitt, MD - Resident - Assisting        Preoperative Diagnosis:    Primary osteoarthritis of left hip [M16.12]    Post-op Diagnosis      * Primary osteoarthritis of left hip [M16.12]    Procedure(s) (LRB):  LEFT TOTAL HIP ARTHROPLASTY (Left)    Anesthesia Type: General    Drains: none    Complications: None    Implants: Stryker  Tritanium Hemispherical Cluster Hole Shell:  60mm  Accolade II stem:  Size 10  Biolox Delta ceramic head:  36mm x -2.33mm  Torx 6.61mm Cancellous screw   Trident Polyethylene acetabular liner: 36mm, 0deg.    Indications:  The patient has end-stage arthritis of the left hip.  Conservative management as documented in the H&P has been attempted and failed to provide adequate relief. The pain from the arthritis is significantly impacting the patient's ability to participate in their desired activities and is having a significant impact on their quality of life. As such, the patient has elected to undergo total hip arthroplasty.  Risks and benefits were discussed with the patient at length in both the office and in the preoperative setting.    Description and Findings of Operative Procedure:    The patient was brought to the operating room placed on the operating table in the lateral position. All bony prominences were padded, and a Foley catheter was placed prior to turning lateral.  Patient received perioperative antibiotics prior to start of surgery.  After appropriate timeout, an incision was made over the posterior lateral aspect of the greater trochanter.  Dissection was carried through fat to fascia.  A Cobb elevator was used to create a area of visible fascia for ease of closure at the completion of the case.  The fascia of the IT band and glut was incised using electrocautery.  The gluteus maximus was split in line with its fibers.  This exposed the greater trochanteric bursa.  A Charnley retractor was then placed with care being taken to avoid retracting on the sciatic nerve.  The trochanteric bursa was removed with electrocautery.  The abductors were identified and protected with a cobra retractor.  The leg was internally rotated, and the piriformis and posterior capsule as well as external rotators were removed from their insertions in a single layer, exposing the joint.  The hip was dislocated posteriorly. Electrocautery was used to mark the femoral neck cut.  The cut was planned to go from 1cm proximal to the lesser trochanter to the superior intersection of the greater trochanter and femoral neck. A reciprocating saw was used to make the femoral neck cut.  The femoral head with remaining attached femoral neck was excised.  ???  The leg was brought back into neutral rotation, and a J retractor was placed over the anterior superior brim of the acetabulum in order to retract the femur and gain exposure to the acetabulum.  The inferior capsule was split with electrocautery also to gain better exposure.  A Hohmann retractor was used to retract the posterior capsule from the posterior acetabulum.  The labrum was sharply excised.  The pulvinar and  round ligament were excised, and hemostasis was achieved with electrocautery.  Next, progressively enlarging acetabular reamers were used to ream the acetabulum.  The acetabulum was medialized to the depth of the condyloid fossa.  Once the surfaces of the acetabulum were visibly bleeding, cancelous bone, the appropriately sized acetabular cup was impacted into place.  Care was taken to achieve appropriate version and slope.  A flexible drill was used to make a drill hole through the acetabular shell and into the pelvis.  A measuring device was used to gauge the appropriately sized screws, and a 6.97mm Torx screw was placed.  A trial liner was inserted into the cup, and the J retractor was removed.  ???  Next attention was given to the femur.  Some remaining soft tissue attachments at the neck/greater trochanteric intersection was removed.  A box osteotome was used to remove the remaining superior cortex of the femoral neck.  The canal finder was then used to access the femoral canal.  Next, progressively enlarging broaches were used to broach the femoral canal until a excellent fit was achieved.  The calcar planer was used to even the neck cut around the inserted broach.  Trial neck and femoral head were placed, and the hip was reduced.  Hip was taken through a range of motion to check for stability.  It was brought into 90??? of flexion with neutral abduction and internally rotated until dislocation.  It was then brought into adduction and internally rotated until dislocation.  Finally was brought into full extension and externally rotated and found to be stable without impingement.  The range of motion where stability had been achieved was felt to be satisfactory. All trial implants were removed.  The wound was copiously irrigated with pulse lavage.  The final femoral component, head ball, and polyethylene liner were impacted into place and the hip was reduced.  It was again taken through range of motion as documented above and found to be satisfactorily stable.  ???  Multimodal local anaesthetic was injected into the capsule, surrounding musculature, and subcutaneous tissue.  A posterior capsular repair was then performed.  This began with a #1 Vicryl suture in the inferior capsule.  Next, #5 Ethibond was used to secure the posterior capsule, external rotators, and piriformis back to the greater trochanter through bone tunnels.  The Ethibond was secured tightly, and the repair was palpated and found to be strong. An aqueous solution of 1g of TXA and 1 g of vancomycin was applied topically to the implant and soft tissues. Deep fascia was closed with a running #1 Vicryl.  Skin was approximated with 2-0 PDS in buried subdermal stitches.  Skin was closed with a running 3-0 Monocryl subcuticular stitch.  Dermabond was placed and allowed to dry.  4 x 4 and Tegaderm was placed as dressing.  The patient was awoken from anesthesia with no apparent complications and transported to PACU in stable condition.  ???  Postoperative plan: Patient will be weightbearing as tolerated.   Patient will be admitted to the hospital, will be instructed on posterior hip precautions, receive appropriate DVT prophylaxis and perioperative antibiotics.      Estimated Blood Loss: 75cc    Specimen(s) Removed/Disposition:   ID Type Source Tests Collected by Time Destination   1 : LEFT FEMORAL HEAD FOR **DISPOSAL ONLY** Bone Femoral Head SURGICAL PATHOLOGY          Sula Rumple, MD 02/28/2017 [phone]  Hennesy Sobalvarro R Shaketha Jeon, MD  Pager

## 2017-03-08 ENCOUNTER — Encounter: Admit: 2017-03-08 | Discharge: 2017-03-08 | Payer: MEDICARE

## 2017-03-08 MED ORDER — OXYCODONE 5 MG PO TAB
5-15 mg | ORAL_TABLET | ORAL | 0 refills | 6.00000 days | Status: AC | PRN
Start: 2017-03-08 — End: 2017-04-16

## 2017-03-08 NOTE — Telephone Encounter
Pt requesting refill of oxycodone. Pt okay with me placing script in the mail as he lives in [place] and his son would not be able to pick up script at office until 12/17 or 12/18. Pt reports he is doing really well. No edema, bruising. He is ambulating with walker and overall feels "great".

## 2017-04-01 ENCOUNTER — Ambulatory Visit: Admit: 2017-04-01 | Discharge: 2017-04-02 | Payer: MEDICARE

## 2017-04-01 ENCOUNTER — Encounter: Admit: 2017-04-01 | Discharge: 2017-04-01 | Payer: MEDICARE

## 2017-04-01 DIAGNOSIS — Z9889 Other specified postprocedural states: ICD-10-CM

## 2017-04-01 DIAGNOSIS — I10 Essential (primary) hypertension: ICD-10-CM

## 2017-04-01 DIAGNOSIS — I4892 Unspecified atrial flutter: ICD-10-CM

## 2017-04-01 DIAGNOSIS — E669 Obesity, unspecified: ICD-10-CM

## 2017-04-01 DIAGNOSIS — I4891 Unspecified atrial fibrillation: ICD-10-CM

## 2017-04-01 DIAGNOSIS — F419 Anxiety disorder, unspecified: ICD-10-CM

## 2017-04-01 DIAGNOSIS — M1990 Unspecified osteoarthritis, unspecified site: ICD-10-CM

## 2017-04-01 DIAGNOSIS — E119 Type 2 diabetes mellitus without complications: ICD-10-CM

## 2017-04-01 DIAGNOSIS — Z8669 Personal history of other diseases of the nervous system and sense organs: ICD-10-CM

## 2017-04-01 DIAGNOSIS — F329 Major depressive disorder, single episode, unspecified: ICD-10-CM

## 2017-04-01 DIAGNOSIS — R079 Chest pain, unspecified: ICD-10-CM

## 2017-04-01 DIAGNOSIS — N189 Chronic kidney disease, unspecified: ICD-10-CM

## 2017-04-01 DIAGNOSIS — E785 Hyperlipidemia, unspecified: ICD-10-CM

## 2017-04-01 MED ORDER — HYDROCODONE-ACETAMINOPHEN 5-325 MG PO TAB
1-2 | ORAL_TABLET | ORAL | 0 refills | 15.00000 days | Status: AC | PRN
Start: 2017-04-01 — End: 2017-05-13

## 2017-04-02 DIAGNOSIS — Z9889 Other specified postprocedural states: Principal | ICD-10-CM

## 2017-04-16 ENCOUNTER — Encounter: Admit: 2017-04-16 | Discharge: 2017-04-16 | Payer: MEDICARE

## 2017-04-16 ENCOUNTER — Ambulatory Visit: Admit: 2017-04-16 | Discharge: 2017-04-17 | Payer: MEDICARE

## 2017-04-16 DIAGNOSIS — R079 Chest pain, unspecified: ICD-10-CM

## 2017-04-16 DIAGNOSIS — E785 Hyperlipidemia, unspecified: ICD-10-CM

## 2017-04-16 DIAGNOSIS — E669 Obesity, unspecified: ICD-10-CM

## 2017-04-16 DIAGNOSIS — F329 Major depressive disorder, single episode, unspecified: ICD-10-CM

## 2017-04-16 DIAGNOSIS — I10 Essential (primary) hypertension: Principal | ICD-10-CM

## 2017-04-16 DIAGNOSIS — E7800 Pure hypercholesterolemia, unspecified: ICD-10-CM

## 2017-04-16 DIAGNOSIS — I4891 Unspecified atrial fibrillation: ICD-10-CM

## 2017-04-16 DIAGNOSIS — I4892 Unspecified atrial flutter: ICD-10-CM

## 2017-04-16 DIAGNOSIS — Z8669 Personal history of other diseases of the nervous system and sense organs: ICD-10-CM

## 2017-04-16 DIAGNOSIS — N189 Chronic kidney disease, unspecified: ICD-10-CM

## 2017-04-16 DIAGNOSIS — E119 Type 2 diabetes mellitus without complications: ICD-10-CM

## 2017-04-16 DIAGNOSIS — F419 Anxiety disorder, unspecified: ICD-10-CM

## 2017-04-16 DIAGNOSIS — Z9889 Other specified postprocedural states: ICD-10-CM

## 2017-04-16 DIAGNOSIS — M1990 Unspecified osteoarthritis, unspecified site: ICD-10-CM

## 2017-04-16 MED ORDER — OXYCODONE 5 MG PO TAB
5-15 mg | ORAL_TABLET | ORAL | 0 refills | Status: SS | PRN
Start: 2017-04-16 — End: 2017-05-29

## 2017-05-13 ENCOUNTER — Encounter: Admit: 2017-05-13 | Discharge: 2017-05-13 | Payer: MEDICARE

## 2017-05-13 ENCOUNTER — Ambulatory Visit: Admit: 2017-05-13 | Discharge: 2017-05-14 | Payer: MEDICARE

## 2017-05-13 DIAGNOSIS — Z9889 Other specified postprocedural states: ICD-10-CM

## 2017-05-13 DIAGNOSIS — E785 Hyperlipidemia, unspecified: ICD-10-CM

## 2017-05-13 DIAGNOSIS — M1990 Unspecified osteoarthritis, unspecified site: ICD-10-CM

## 2017-05-13 DIAGNOSIS — R079 Chest pain, unspecified: ICD-10-CM

## 2017-05-13 DIAGNOSIS — Z8669 Personal history of other diseases of the nervous system and sense organs: ICD-10-CM

## 2017-05-13 DIAGNOSIS — I10 Essential (primary) hypertension: ICD-10-CM

## 2017-05-13 DIAGNOSIS — E119 Type 2 diabetes mellitus without complications: ICD-10-CM

## 2017-05-13 DIAGNOSIS — M1611 Unilateral primary osteoarthritis, right hip: Principal | ICD-10-CM

## 2017-05-13 DIAGNOSIS — F329 Major depressive disorder, single episode, unspecified: ICD-10-CM

## 2017-05-13 DIAGNOSIS — I4892 Unspecified atrial flutter: ICD-10-CM

## 2017-05-13 DIAGNOSIS — E669 Obesity, unspecified: ICD-10-CM

## 2017-05-13 DIAGNOSIS — N189 Chronic kidney disease, unspecified: ICD-10-CM

## 2017-05-13 DIAGNOSIS — F419 Anxiety disorder, unspecified: ICD-10-CM

## 2017-05-13 DIAGNOSIS — I4891 Unspecified atrial fibrillation: ICD-10-CM

## 2017-05-13 MED ORDER — HYDROCODONE-ACETAMINOPHEN 5-325 MG PO TAB
1-2 | ORAL_TABLET | ORAL | 0 refills | 15.00000 days | Status: AC | PRN
Start: 2017-05-13 — End: 2017-05-31

## 2017-05-14 ENCOUNTER — Encounter: Admit: 2017-05-14 | Discharge: 2017-05-14 | Payer: MEDICARE

## 2017-05-16 ENCOUNTER — Encounter: Admit: 2017-05-16 | Discharge: 2017-05-16 | Payer: MEDICARE

## 2017-05-16 DIAGNOSIS — M1611 Unilateral primary osteoarthritis, right hip: Principal | ICD-10-CM

## 2017-05-16 DIAGNOSIS — Z96641 Presence of right artificial hip joint: ICD-10-CM

## 2017-05-16 DIAGNOSIS — M79604 Pain in right leg: ICD-10-CM

## 2017-05-16 MED ORDER — CEFAZOLIN INJ 1GM IVP
2 g | Freq: Once | INTRAVENOUS | 0 refills | Status: CN
Start: 2017-05-16 — End: ?

## 2017-05-17 ENCOUNTER — Encounter: Admit: 2017-05-17 | Discharge: 2017-05-17 | Payer: MEDICARE

## 2017-05-17 ENCOUNTER — Ambulatory Visit: Admit: 2017-05-17 | Discharge: 2017-05-18 | Payer: MEDICARE

## 2017-05-17 DIAGNOSIS — E119 Type 2 diabetes mellitus without complications: ICD-10-CM

## 2017-05-17 DIAGNOSIS — I4892 Unspecified atrial flutter: Secondary | ICD-10-CM

## 2017-05-17 DIAGNOSIS — M1990 Unspecified osteoarthritis, unspecified site: ICD-10-CM

## 2017-05-17 DIAGNOSIS — F329 Major depressive disorder, single episode, unspecified: ICD-10-CM

## 2017-05-17 DIAGNOSIS — N189 Chronic kidney disease, unspecified: ICD-10-CM

## 2017-05-17 DIAGNOSIS — I10 Essential (primary) hypertension: Secondary | ICD-10-CM

## 2017-05-17 DIAGNOSIS — I4891 Unspecified atrial fibrillation: ICD-10-CM

## 2017-05-17 DIAGNOSIS — R079 Chest pain, unspecified: ICD-10-CM

## 2017-05-17 DIAGNOSIS — F419 Anxiety disorder, unspecified: ICD-10-CM

## 2017-05-17 DIAGNOSIS — E669 Obesity, unspecified: ICD-10-CM

## 2017-05-17 DIAGNOSIS — Z9889 Other specified postprocedural states: ICD-10-CM

## 2017-05-17 DIAGNOSIS — Z8669 Personal history of other diseases of the nervous system and sense organs: ICD-10-CM

## 2017-05-17 DIAGNOSIS — E785 Hyperlipidemia, unspecified: ICD-10-CM

## 2017-05-18 DIAGNOSIS — Z01810 Encounter for preprocedural cardiovascular examination: Principal | ICD-10-CM

## 2017-05-18 DIAGNOSIS — I10 Essential (primary) hypertension: ICD-10-CM

## 2017-05-28 ENCOUNTER — Inpatient Hospital Stay: Admit: 2017-05-27 | Discharge: 2017-05-27 | Payer: MEDICARE

## 2017-05-28 ENCOUNTER — Encounter: Admit: 2017-05-28 | Discharge: 2017-05-28 | Payer: MEDICARE

## 2017-05-28 ENCOUNTER — Inpatient Hospital Stay: Admit: 2017-05-28 | Discharge: 2017-05-31 | Disposition: A | Discharge status: Home Health | Payer: MEDICARE

## 2017-05-28 DIAGNOSIS — F419 Anxiety disorder, unspecified: ICD-10-CM

## 2017-05-28 DIAGNOSIS — N189 Chronic kidney disease, unspecified: ICD-10-CM

## 2017-05-28 DIAGNOSIS — M25551 Pain in right hip: Secondary | ICD-10-CM

## 2017-05-28 DIAGNOSIS — R079 Chest pain, unspecified: ICD-10-CM

## 2017-05-28 DIAGNOSIS — E119 Type 2 diabetes mellitus without complications: ICD-10-CM

## 2017-05-28 DIAGNOSIS — I4891 Unspecified atrial fibrillation: ICD-10-CM

## 2017-05-28 DIAGNOSIS — F329 Major depressive disorder, single episode, unspecified: ICD-10-CM

## 2017-05-28 DIAGNOSIS — E669 Obesity, unspecified: ICD-10-CM

## 2017-05-28 DIAGNOSIS — Z9889 Other specified postprocedural states: ICD-10-CM

## 2017-05-28 DIAGNOSIS — I10 Essential (primary) hypertension: Secondary | ICD-10-CM

## 2017-05-28 DIAGNOSIS — E785 Hyperlipidemia, unspecified: ICD-10-CM

## 2017-05-28 DIAGNOSIS — M1990 Unspecified osteoarthritis, unspecified site: ICD-10-CM

## 2017-05-28 DIAGNOSIS — I4892 Unspecified atrial flutter: ICD-10-CM

## 2017-05-28 DIAGNOSIS — Z8669 Personal history of other diseases of the nervous system and sense organs: ICD-10-CM

## 2017-05-28 LAB — POC GLUCOSE
Lab: 122 mg/dL — ABNORMAL HIGH (ref 70–100)
Lab: 152 mg/dL — ABNORMAL HIGH (ref 70–100)

## 2017-05-28 MED ORDER — DEXTRAN 70-HYPROMELLOSE (PF) 0.1-0.3 % OP DPET
0 refills | Status: DC
Start: 2017-05-28 — End: 2017-05-28
  Administered 2017-05-28: 14:00:00 2 [drp] via OPHTHALMIC

## 2017-05-28 MED ORDER — FENTANYL CITRATE (PF) 50 MCG/ML IJ SOLN
25 ug | INTRAVENOUS | 0 refills | Status: DC | PRN
Start: 2017-05-28 — End: 2017-05-28

## 2017-05-28 MED ORDER — TEMAZEPAM 15 MG PO CAP
15 mg | Freq: Every evening | ORAL | 0 refills | Status: DC | PRN
Start: 2017-05-28 — End: 2017-05-31

## 2017-05-28 MED ORDER — FAMOTIDINE 20 MG PO TAB
20 mg | Freq: Two times a day (BID) | ORAL | 0 refills | Status: DC
Start: 2017-05-28 — End: 2017-05-31
  Administered 2017-05-29 – 2017-05-30 (×4): 20 mg via ORAL

## 2017-05-28 MED ORDER — DEXAMETHASONE SODIUM PHOSPHATE 4 MG/ML IJ SOLN
INTRAVENOUS | 0 refills | Status: DC
Start: 2017-05-28 — End: 2017-05-28
  Administered 2017-05-28: 15:00:00 4 mg via INTRAVENOUS

## 2017-05-28 MED ORDER — LOSARTAN 50 MG PO TAB
100 mg | Freq: Every day | ORAL | 0 refills | Status: DC
Start: 2017-05-28 — End: 2017-05-31
  Administered 2017-05-29 – 2017-05-30 (×2): 100 mg via ORAL

## 2017-05-28 MED ORDER — BENZOCAINE-MENTHOL 6-10 MG MM LOZG
1 | ORAL | 0 refills | Status: DC | PRN
Start: 2017-05-28 — End: 2017-05-31

## 2017-05-28 MED ORDER — LACTATED RINGERS IV SOLP
INTRAVENOUS | 0 refills | Status: AC
Start: 2017-05-28 — End: ?
  Administered 2017-05-28 – 2017-05-29 (×2): 1000.000 mL via INTRAVENOUS

## 2017-05-28 MED ORDER — HYDROMORPHONE (PF) 2 MG/ML IJ SYRG
0 refills | Status: DC
Start: 2017-05-28 — End: 2017-05-28
  Administered 2017-05-28 (×4): 0.5 mg via INTRAVENOUS

## 2017-05-28 MED ORDER — LIDOCAINE (PF) 10 MG/ML (1 %) IJ SOLN
.1-2 mL | INTRAMUSCULAR | 0 refills | Status: DC | PRN
Start: 2017-05-28 — End: 2017-05-28

## 2017-05-28 MED ORDER — TRANEXAMIC ACID IN NS IVPB (AM)(OR)
1 g | Freq: Once | INTRAVENOUS | 0 refills | Status: CP
Start: 2017-05-28 — End: ?
  Administered 2017-05-28 (×2): 1 g via INTRAVENOUS

## 2017-05-28 MED ORDER — OXYCODONE 5 MG PO TAB
5-10 mg | Freq: Once | ORAL | 0 refills | Status: CP | PRN
Start: 2017-05-28 — End: ?
  Administered 2017-05-28: 17:00:00 10 mg via ORAL

## 2017-05-28 MED ORDER — FENTANYL CITRATE (PF) 50 MCG/ML IJ SOLN
0 refills | Status: DC
Start: 2017-05-28 — End: 2017-05-28
  Administered 2017-05-28: 14:00:00 100 ug via INTRAVENOUS
  Administered 2017-05-28: 15:00:00 50 ug via INTRAVENOUS

## 2017-05-28 MED ORDER — LIDOCAINE (PF) 200 MG/10 ML (2 %) IJ SYRG
0 refills | Status: DC
Start: 2017-05-28 — End: 2017-05-28
  Administered 2017-05-28: 14:00:00 80 mg via INTRAVENOUS

## 2017-05-28 MED ORDER — ATORVASTATIN 40 MG PO TAB
40 mg | Freq: Every day | ORAL | 0 refills | Status: DC
Start: 2017-05-28 — End: 2017-05-31
  Administered 2017-05-29 – 2017-05-30 (×2): 40 mg via ORAL

## 2017-05-28 MED ORDER — MIDAZOLAM 1 MG/ML IJ SOLN
INTRAVENOUS | 0 refills | Status: DC
Start: 2017-05-28 — End: 2017-05-28
  Administered 2017-05-28: 14:00:00 2 mg via INTRAVENOUS

## 2017-05-28 MED ORDER — CEFAZOLIN INJ 1GM IVP
2 g | INTRAVENOUS | 0 refills | Status: CP
Start: 2017-05-28 — End: ?
  Administered 2017-05-28 – 2017-05-29 (×2): 2 g via INTRAVENOUS

## 2017-05-28 MED ORDER — EPHEDRINE SULFATE 50 MG/5ML SYR (10 MG/ML) (AN)(OSM)
0 refills | Status: DC
Start: 2017-05-28 — End: 2017-05-28
  Administered 2017-05-28 (×2): 10 mg via INTRAVENOUS

## 2017-05-28 MED ORDER — POTASSIUM CHLORIDE 20 MEQ PO TBTQ
20 meq | Freq: Every day | ORAL | 0 refills | Status: DC
Start: 2017-05-28 — End: 2017-05-31
  Administered 2017-05-29 – 2017-05-30 (×2): 20 meq via ORAL

## 2017-05-28 MED ORDER — NITROGLYCERIN 0.4 MG SL SUBL
0.4 mg | SUBLINGUAL | 0 refills | Status: DC | PRN
Start: 2017-05-28 — End: 2017-05-31

## 2017-05-28 MED ORDER — ALUMINUM-MAGNESIUM HYDROXIDE 200-200 MG/5 ML PO SUSP
30 mL | Freq: Four times a day (QID) | ORAL | 0 refills | Status: DC | PRN
Start: 2017-05-28 — End: 2017-05-31

## 2017-05-28 MED ORDER — PROPOFOL INJ 10 MG/ML IV VIAL
0 refills | Status: DC
Start: 2017-05-28 — End: 2017-05-28
  Administered 2017-05-28: 14:00:00 100 mg via INTRAVENOUS

## 2017-05-28 MED ORDER — ONDANSETRON HCL (PF) 4 MG/2 ML IJ SOLN
4 mg | Freq: Once | INTRAVENOUS | 0 refills | Status: DC | PRN
Start: 2017-05-28 — End: 2017-05-28

## 2017-05-28 MED ORDER — ROCURONIUM 10 MG/ML IV SOLN
INTRAVENOUS | 0 refills | Status: DC
Start: 2017-05-28 — End: 2017-05-28
  Administered 2017-05-28: 14:00:00 20 mg via INTRAVENOUS
  Administered 2017-05-28: 14:00:00 50 mg via INTRAVENOUS
  Administered 2017-05-28: 15:00:00 10 mg via INTRAVENOUS
  Administered 2017-05-28: 15:00:00 20 mg via INTRAVENOUS

## 2017-05-28 MED ORDER — ACETAMINOPHEN 500 MG PO TAB
1000 mg | Freq: Once | ORAL | 0 refills | Status: CP
Start: 2017-05-28 — End: ?
  Administered 2017-05-28: 14:00:00 1000 mg via ORAL

## 2017-05-28 MED ORDER — VANCOMYCIN 1,000 MG IV SOLR
0 refills | Status: DC
Start: 2017-05-28 — End: 2017-05-28
  Administered 2017-05-28: 15:00:00 1 g via TOPICAL

## 2017-05-28 MED ORDER — LACTATED RINGERS IV SOLP
INTRAVENOUS | 0 refills | Status: DC
Start: 2017-05-28 — End: 2017-05-28

## 2017-05-28 MED ORDER — ASPIRIN 81 MG PO TBEC
81 mg | Freq: Two times a day (BID) | ORAL | 0 refills | Status: DC
Start: 2017-05-28 — End: 2017-05-31
  Administered 2017-05-28 – 2017-05-30 (×5): 81 mg via ORAL

## 2017-05-28 MED ORDER — ACETAMINOPHEN 325 MG PO TAB
650 mg | Freq: Four times a day (QID) | ORAL | 0 refills | Status: DC
Start: 2017-05-28 — End: 2017-05-31
  Administered 2017-05-28 – 2017-05-30 (×9): 650 mg via ORAL

## 2017-05-28 MED ORDER — CARVEDILOL 3.125 MG PO TAB
3.125 mg | Freq: Two times a day (BID) | ORAL | 0 refills | Status: DC
Start: 2017-05-28 — End: 2017-05-31
  Administered 2017-05-29 – 2017-05-30 (×2): 3.125 mg via ORAL

## 2017-05-28 MED ORDER — MORPHINE 2 MG/ML IV CRTG
1-4 mg | INTRAVENOUS | 0 refills | Status: AC | PRN
Start: 2017-05-28 — End: ?

## 2017-05-28 MED ORDER — MAGNESIUM HYDROXIDE 2,400 MG/10 ML PO SUSP
10 mL | Freq: Every day | ORAL | 0 refills | Status: DC
Start: 2017-05-28 — End: 2017-05-31
  Administered 2017-05-29 – 2017-05-30 (×2): 10 mL via ORAL

## 2017-05-28 MED ORDER — LACTATED RINGERS IV SOLP
1000 mL | INTRAVENOUS | 0 refills | Status: DC
Start: 2017-05-28 — End: 2017-05-28
  Administered 2017-05-28: 14:00:00 1000 mL via INTRAVENOUS

## 2017-05-28 MED ORDER — BISACODYL 10 MG RE SUPP
10 mg | Freq: Every day | RECTAL | 0 refills | Status: DC | PRN
Start: 2017-05-28 — End: 2017-05-31

## 2017-05-28 MED ORDER — FENTANYL CITRATE (PF) 50 MCG/ML IJ SOLN
50 ug | INTRAVENOUS | 0 refills | Status: DC | PRN
Start: 2017-05-28 — End: 2017-05-28
  Administered 2017-05-28 (×3): 50 ug via INTRAVENOUS

## 2017-05-28 MED ORDER — GABAPENTIN 300 MG PO CAP
300 mg | Freq: Once | ORAL | 0 refills | Status: CP
Start: 2017-05-28 — End: ?
  Administered 2017-05-28: 14:00:00 300 mg via ORAL

## 2017-05-28 MED ORDER — METFORMIN 500 MG PO TAB
1000 mg | Freq: Two times a day (BID) | ORAL | 0 refills | Status: DC
Start: 2017-05-28 — End: 2017-05-31
  Administered 2017-05-29 – 2017-05-30 (×4): 1000 mg via ORAL

## 2017-05-28 MED ORDER — HYDROCHLOROTHIAZIDE 25 MG PO TAB
25 mg | Freq: Every morning | ORAL | 0 refills | Status: DC
Start: 2017-05-28 — End: 2017-05-31
  Administered 2017-05-29 – 2017-05-30 (×2): 25 mg via ORAL

## 2017-05-28 MED ORDER — DOCUSATE SODIUM 100 MG PO CAP
100 mg | Freq: Two times a day (BID) | ORAL | 0 refills | Status: DC
Start: 2017-05-28 — End: 2017-05-31
  Administered 2017-05-29 – 2017-05-30 (×4): 100 mg via ORAL

## 2017-05-28 MED ORDER — CITALOPRAM 20 MG PO TAB
20 mg | Freq: Every morning | ORAL | 0 refills | Status: DC
Start: 2017-05-28 — End: 2017-05-31
  Administered 2017-05-29 – 2017-05-30 (×2): 20 mg via ORAL

## 2017-05-28 MED ORDER — CEFAZOLIN INJ 1GM IVP
2 g | Freq: Once | INTRAVENOUS | 0 refills | Status: CP
Start: 2017-05-28 — End: ?
  Administered 2017-05-28: 14:00:00 2 g via INTRAVENOUS

## 2017-05-28 MED ORDER — SUGAMMADEX 100 MG/ML IV SOLN
INTRAVENOUS | 0 refills | Status: DC
Start: 2017-05-28 — End: 2017-05-28
  Administered 2017-05-28: 16:00:00 220 mg via INTRAVENOUS

## 2017-05-28 MED ORDER — ONDANSETRON HCL (PF) 4 MG/2 ML IJ SOLN
4 mg | INTRAVENOUS | 0 refills | Status: DC | PRN
Start: 2017-05-28 — End: 2017-05-31

## 2017-05-28 MED ORDER — ONDANSETRON HCL (PF) 4 MG/2 ML IJ SOLN
INTRAVENOUS | 0 refills | Status: DC
Start: 2017-05-28 — End: 2017-05-28
  Administered 2017-05-28: 15:00:00 4 mg via INTRAVENOUS

## 2017-05-28 MED ORDER — TRANEXAMIC ACID 1,000 MG/10 ML (100 MG/ML) IV SOLN
0 refills | Status: DC
Start: 2017-05-28 — End: 2017-05-28
  Administered 2017-05-28: 15:00:00 10 mL via TOPICAL

## 2017-05-28 MED ORDER — HYDROMORPHONE (PF) 2 MG/ML IJ SYRG
.5-1 mg | INTRAVENOUS | 0 refills | Status: DC | PRN
Start: 2017-05-28 — End: 2017-05-28
  Administered 2017-05-28 (×3): 0.5 mg via INTRAVENOUS

## 2017-05-28 MED ORDER — OXYCODONE 5 MG PO TAB
5-10 mg | ORAL | 0 refills | Status: DC | PRN
Start: 2017-05-28 — End: 2017-05-29
  Administered 2017-05-28 – 2017-05-29 (×3): 10 mg via ORAL

## 2017-05-29 LAB — BASIC METABOLIC PANEL: Lab: 134 MMOL/L — ABNORMAL LOW (ref 137–147)

## 2017-05-29 LAB — CBC: Lab: 11 K/UL — ABNORMAL HIGH (ref >60)

## 2017-05-29 MED ORDER — OXYCODONE 5 MG PO TAB
5-15 mg | ORAL | 0 refills | Status: DC | PRN
Start: 2017-05-29 — End: 2017-05-31
  Administered 2017-05-29 – 2017-05-30 (×8): 15 mg via ORAL

## 2017-05-29 MED ORDER — OXYCODONE 5 MG PO TAB
5-10 mg | ORAL | 0 refills | Status: DC | PRN
Start: 2017-05-29 — End: 2017-05-29
  Administered 2017-05-29 (×3): 10 mg via ORAL

## 2017-05-30 ENCOUNTER — Encounter: Admit: 2017-05-30 | Discharge: 2017-05-30 | Payer: MEDICARE

## 2017-05-30 DIAGNOSIS — F419 Anxiety disorder, unspecified: ICD-10-CM

## 2017-05-30 DIAGNOSIS — I10 Essential (primary) hypertension: Secondary | ICD-10-CM

## 2017-05-30 DIAGNOSIS — E785 Hyperlipidemia, unspecified: ICD-10-CM

## 2017-05-30 DIAGNOSIS — Z9889 Other specified postprocedural states: ICD-10-CM

## 2017-05-30 DIAGNOSIS — E669 Obesity, unspecified: ICD-10-CM

## 2017-05-30 DIAGNOSIS — F329 Major depressive disorder, single episode, unspecified: ICD-10-CM

## 2017-05-30 DIAGNOSIS — R079 Chest pain, unspecified: ICD-10-CM

## 2017-05-30 DIAGNOSIS — I4892 Unspecified atrial flutter: ICD-10-CM

## 2017-05-30 DIAGNOSIS — E119 Type 2 diabetes mellitus without complications: ICD-10-CM

## 2017-05-30 DIAGNOSIS — I4891 Unspecified atrial fibrillation: ICD-10-CM

## 2017-05-30 DIAGNOSIS — Z8669 Personal history of other diseases of the nervous system and sense organs: ICD-10-CM

## 2017-05-30 DIAGNOSIS — N189 Chronic kidney disease, unspecified: ICD-10-CM

## 2017-05-30 DIAGNOSIS — M1990 Unspecified osteoarthritis, unspecified site: ICD-10-CM

## 2017-05-30 LAB — CBC: Lab: 13 10*3/uL — ABNORMAL HIGH (ref 4.5–11.0)

## 2017-05-30 LAB — BASIC METABOLIC PANEL: Lab: 132 MMOL/L — ABNORMAL LOW (ref 137–147)

## 2017-05-30 MED ORDER — SENNOSIDES-DOCUSATE SODIUM 8.6-50 MG PO TAB
1 | ORAL_TABLET | Freq: Two times a day (BID) | ORAL | 3 refills | Status: AC
Start: 2017-05-30 — End: ?

## 2017-05-30 MED ORDER — ASPIRIN 81 MG PO TBEC
81 mg | ORAL_TABLET | Freq: Two times a day (BID) | ORAL | 0 refills | Status: AC
Start: 2017-05-30 — End: ?

## 2017-05-30 MED ORDER — OXYCODONE 5 MG PO TAB
5-10 mg | ORAL_TABLET | ORAL | 0 refills | 6.00000 days | Status: AC | PRN
Start: 2017-05-30 — End: 2017-06-07

## 2017-05-31 ENCOUNTER — Inpatient Hospital Stay: Admit: 2017-05-28 | Discharge: 2017-05-28 | Payer: MEDICARE

## 2017-05-31 ENCOUNTER — Encounter: Admit: 2017-05-28 | Discharge: 2017-05-28 | Payer: MEDICARE

## 2017-05-31 DIAGNOSIS — I4891 Unspecified atrial fibrillation: ICD-10-CM

## 2017-05-31 DIAGNOSIS — Z9049 Acquired absence of other specified parts of digestive tract: ICD-10-CM

## 2017-05-31 DIAGNOSIS — N183 Chronic kidney disease, stage 3 (moderate): ICD-10-CM

## 2017-05-31 DIAGNOSIS — Z96642 Presence of left artificial hip joint: ICD-10-CM

## 2017-05-31 DIAGNOSIS — Z6832 Body mass index (BMI) 32.0-32.9, adult: ICD-10-CM

## 2017-05-31 DIAGNOSIS — E669 Obesity, unspecified: ICD-10-CM

## 2017-05-31 DIAGNOSIS — E1122 Type 2 diabetes mellitus with diabetic chronic kidney disease: ICD-10-CM

## 2017-05-31 DIAGNOSIS — F419 Anxiety disorder, unspecified: ICD-10-CM

## 2017-05-31 DIAGNOSIS — I129 Hypertensive chronic kidney disease with stage 1 through stage 4 chronic kidney disease, or unspecified chronic kidney disease: ICD-10-CM

## 2017-05-31 DIAGNOSIS — Z7984 Long term (current) use of oral hypoglycemic drugs: ICD-10-CM

## 2017-05-31 DIAGNOSIS — M1611 Unilateral primary osteoarthritis, right hip: Principal | ICD-10-CM

## 2017-05-31 DIAGNOSIS — E785 Hyperlipidemia, unspecified: ICD-10-CM

## 2017-05-31 DIAGNOSIS — Z87891 Personal history of nicotine dependence: ICD-10-CM

## 2017-05-31 DIAGNOSIS — F329 Major depressive disorder, single episode, unspecified: ICD-10-CM

## 2017-06-07 ENCOUNTER — Encounter: Admit: 2017-06-07 | Discharge: 2017-06-07 | Payer: MEDICARE

## 2017-06-07 MED ORDER — OXYCODONE 5 MG PO TAB
5-10 mg | ORAL_TABLET | ORAL | 0 refills | Status: SS | PRN
Start: 2017-06-07 — End: 2017-08-19

## 2017-06-19 ENCOUNTER — Ambulatory Visit: Admit: 2017-06-19 | Discharge: 2017-06-20 | Payer: MEDICARE

## 2017-06-19 ENCOUNTER — Encounter: Admit: 2017-06-19 | Discharge: 2017-06-19 | Payer: MEDICARE

## 2017-06-19 DIAGNOSIS — I4892 Unspecified atrial flutter: ICD-10-CM

## 2017-06-19 DIAGNOSIS — Z9889 Other specified postprocedural states: ICD-10-CM

## 2017-06-19 DIAGNOSIS — F329 Major depressive disorder, single episode, unspecified: ICD-10-CM

## 2017-06-19 DIAGNOSIS — R079 Chest pain, unspecified: ICD-10-CM

## 2017-06-19 DIAGNOSIS — Z8669 Personal history of other diseases of the nervous system and sense organs: ICD-10-CM

## 2017-06-19 DIAGNOSIS — N189 Chronic kidney disease, unspecified: ICD-10-CM

## 2017-06-19 DIAGNOSIS — E785 Hyperlipidemia, unspecified: ICD-10-CM

## 2017-06-19 DIAGNOSIS — M1990 Unspecified osteoarthritis, unspecified site: ICD-10-CM

## 2017-06-19 DIAGNOSIS — I10 Essential (primary) hypertension: Secondary | ICD-10-CM

## 2017-06-19 DIAGNOSIS — E119 Type 2 diabetes mellitus without complications: ICD-10-CM

## 2017-06-19 DIAGNOSIS — E669 Obesity, unspecified: ICD-10-CM

## 2017-06-19 DIAGNOSIS — I4891 Unspecified atrial fibrillation: ICD-10-CM

## 2017-06-19 DIAGNOSIS — F419 Anxiety disorder, unspecified: ICD-10-CM

## 2017-06-19 MED ORDER — CEPHALEXIN 500 MG PO CAP
500 mg | ORAL_CAPSULE | Freq: Four times a day (QID) | ORAL | 0 refills | Status: AC
Start: 2017-06-19 — End: ?

## 2017-06-19 MED ORDER — HYDROCODONE-ACETAMINOPHEN 5-325 MG PO TAB
1-2 | ORAL_TABLET | ORAL | 0 refills | 30.00000 days | Status: AC | PRN
Start: 2017-06-19 — End: 2017-07-04

## 2017-06-20 DIAGNOSIS — Z9889 Other specified postprocedural states: Principal | ICD-10-CM

## 2017-06-27 ENCOUNTER — Encounter: Admit: 2017-06-27 | Discharge: 2017-06-27 | Payer: MEDICARE

## 2017-06-27 DIAGNOSIS — I10 Essential (primary) hypertension: Secondary | ICD-10-CM

## 2017-06-27 DIAGNOSIS — E119 Type 2 diabetes mellitus without complications: ICD-10-CM

## 2017-06-27 DIAGNOSIS — N189 Chronic kidney disease, unspecified: ICD-10-CM

## 2017-06-27 DIAGNOSIS — Z9889 Other specified postprocedural states: ICD-10-CM

## 2017-06-27 DIAGNOSIS — I4892 Unspecified atrial flutter: ICD-10-CM

## 2017-06-27 DIAGNOSIS — I4891 Unspecified atrial fibrillation: ICD-10-CM

## 2017-06-27 DIAGNOSIS — E669 Obesity, unspecified: ICD-10-CM

## 2017-06-27 DIAGNOSIS — E785 Hyperlipidemia, unspecified: ICD-10-CM

## 2017-06-27 DIAGNOSIS — F329 Major depressive disorder, single episode, unspecified: ICD-10-CM

## 2017-06-27 DIAGNOSIS — F419 Anxiety disorder, unspecified: ICD-10-CM

## 2017-06-27 DIAGNOSIS — Z8669 Personal history of other diseases of the nervous system and sense organs: ICD-10-CM

## 2017-06-27 DIAGNOSIS — M1990 Unspecified osteoarthritis, unspecified site: ICD-10-CM

## 2017-06-27 DIAGNOSIS — R079 Chest pain, unspecified: ICD-10-CM

## 2017-07-04 ENCOUNTER — Encounter: Admit: 2017-07-04 | Discharge: 2017-07-04 | Payer: MEDICARE

## 2017-07-04 MED ORDER — HYDROCODONE-ACETAMINOPHEN 5-325 MG PO TAB
1-2 | ORAL_TABLET | ORAL | 0 refills | Status: SS | PRN
Start: 2017-07-04 — End: 2017-08-19

## 2017-08-07 ENCOUNTER — Ambulatory Visit: Admit: 2017-08-07 | Discharge: 2017-08-08 | Payer: MEDICARE

## 2017-08-07 ENCOUNTER — Encounter: Admit: 2017-08-07 | Discharge: 2017-08-07 | Payer: MEDICARE

## 2017-08-07 DIAGNOSIS — M1990 Unspecified osteoarthritis, unspecified site: ICD-10-CM

## 2017-08-07 DIAGNOSIS — F329 Major depressive disorder, single episode, unspecified: ICD-10-CM

## 2017-08-07 DIAGNOSIS — E669 Obesity, unspecified: ICD-10-CM

## 2017-08-07 DIAGNOSIS — Z8669 Personal history of other diseases of the nervous system and sense organs: ICD-10-CM

## 2017-08-07 DIAGNOSIS — F419 Anxiety disorder, unspecified: ICD-10-CM

## 2017-08-07 DIAGNOSIS — E785 Hyperlipidemia, unspecified: ICD-10-CM

## 2017-08-07 DIAGNOSIS — E119 Type 2 diabetes mellitus without complications: ICD-10-CM

## 2017-08-07 DIAGNOSIS — M79605 Pain in left leg: Principal | ICD-10-CM

## 2017-08-07 DIAGNOSIS — N189 Chronic kidney disease, unspecified: ICD-10-CM

## 2017-08-07 DIAGNOSIS — I4891 Unspecified atrial fibrillation: ICD-10-CM

## 2017-08-07 DIAGNOSIS — I4892 Unspecified atrial flutter: ICD-10-CM

## 2017-08-07 DIAGNOSIS — I10 Essential (primary) hypertension: Secondary | ICD-10-CM

## 2017-08-07 DIAGNOSIS — R079 Chest pain, unspecified: ICD-10-CM

## 2017-08-07 DIAGNOSIS — Z9889 Other specified postprocedural states: ICD-10-CM

## 2017-08-07 MED ORDER — TRAMADOL 50 MG PO TAB
50 mg | ORAL_TABLET | ORAL | 0 refills | Status: AC | PRN
Start: 2017-08-07 — End: 2017-11-11

## 2017-08-18 ENCOUNTER — Encounter: Admit: 2017-08-18 | Discharge: 2017-08-18 | Payer: MEDICARE

## 2017-08-18 ENCOUNTER — Encounter: Admit: 2017-08-18 | Discharge: 2017-08-19 | Payer: MEDICARE

## 2017-08-18 DIAGNOSIS — M978XXA Periprosthetic fracture around other internal prosthetic joint, initial encounter: ICD-10-CM

## 2017-08-18 DIAGNOSIS — S73004A Unspecified dislocation of right hip, initial encounter: ICD-10-CM

## 2017-08-18 DIAGNOSIS — S72001A Fracture of unspecified part of neck of right femur, initial encounter for closed fracture: Principal | ICD-10-CM

## 2017-08-18 DIAGNOSIS — Z9889 Other specified postprocedural states: ICD-10-CM

## 2017-08-18 MED ORDER — ROPIVACAINE (PF) 5 MG/ML (0.5 %) IJ SOLN
0 refills | Status: DC
Start: 2017-08-18 — End: 2017-08-20
  Administered 2017-08-19: 04:00:00 30 mL

## 2017-08-18 MED ORDER — FENTANYL CITRATE (PF) 50 MCG/ML IJ SOLN
25 ug | INTRAVENOUS | 0 refills | Status: DC | PRN
Start: 2017-08-18 — End: 2017-08-21

## 2017-08-18 MED ORDER — OXYCODONE 5 MG PO TAB
5-10 mg | ORAL | 0 refills | Status: DC | PRN
Start: 2017-08-18 — End: 2017-08-22
  Administered 2017-08-19 – 2017-08-22 (×13): 10 mg via ORAL

## 2017-08-18 MED ORDER — SODIUM CHLORIDE 0.9 % IV SOLP
INTRAVENOUS | 0 refills | Status: DC
Start: 2017-08-18 — End: 2017-08-19
  Administered 2017-08-19: 04:00:00 1000.000 mL via INTRAVENOUS

## 2017-08-18 MED ORDER — LIDOCAINE (PF) 10 MG/ML (1 %) IJ SOLN
0 refills | Status: DC
Start: 2017-08-18 — End: 2017-08-20
  Administered 2017-08-19: 04:00:00 3 mL

## 2017-08-18 MED ORDER — ACETAMINOPHEN 325 MG PO TAB
650 mg | ORAL | 0 refills | Status: AC
Start: 2017-08-18 — End: ?
  Administered 2017-08-19 – 2017-08-22 (×10): 650 mg via ORAL

## 2017-08-18 MED ORDER — BUPIVACAINE 0.125% PCA PNC SYR
PERINEURAL | 0 refills | Status: DC
Start: 2017-08-18 — End: 2017-08-21
  Administered 2017-08-19 – 2017-08-20 (×8): 50.000 mL via PERINEURAL

## 2017-08-18 MED ORDER — GABAPENTIN 300 MG PO CAP
300 mg | Freq: Three times a day (TID) | ORAL | 0 refills | Status: CP
Start: 2017-08-18 — End: ?
  Administered 2017-08-19 – 2017-08-20 (×6): 300 mg via ORAL

## 2017-08-19 ENCOUNTER — Encounter: Admit: 2017-08-19 | Discharge: 2017-08-19 | Payer: MEDICARE

## 2017-08-19 DIAGNOSIS — I10 Essential (primary) hypertension: ICD-10-CM

## 2017-08-19 DIAGNOSIS — E119 Type 2 diabetes mellitus without complications: ICD-10-CM

## 2017-08-19 DIAGNOSIS — F329 Major depressive disorder, single episode, unspecified: ICD-10-CM

## 2017-08-19 DIAGNOSIS — N189 Chronic kidney disease, unspecified: ICD-10-CM

## 2017-08-19 DIAGNOSIS — R079 Chest pain, unspecified: ICD-10-CM

## 2017-08-19 DIAGNOSIS — Z8669 Personal history of other diseases of the nervous system and sense organs: ICD-10-CM

## 2017-08-19 DIAGNOSIS — I4891 Unspecified atrial fibrillation: ICD-10-CM

## 2017-08-19 DIAGNOSIS — E669 Obesity, unspecified: ICD-10-CM

## 2017-08-19 DIAGNOSIS — Z9889 Other specified postprocedural states: ICD-10-CM

## 2017-08-19 DIAGNOSIS — F419 Anxiety disorder, unspecified: ICD-10-CM

## 2017-08-19 DIAGNOSIS — M1990 Unspecified osteoarthritis, unspecified site: ICD-10-CM

## 2017-08-19 DIAGNOSIS — E785 Hyperlipidemia, unspecified: ICD-10-CM

## 2017-08-19 DIAGNOSIS — I4892 Unspecified atrial flutter: ICD-10-CM

## 2017-08-19 LAB — BASIC METABOLIC PANEL
Lab: 1 mg/dL (ref 0.4–1.24)
Lab: 137 MMOL/L — ABNORMAL LOW (ref 137–147)
Lab: 171 mg/dL — ABNORMAL HIGH (ref 70–100)
Lab: 20 MMOL/L — ABNORMAL LOW (ref 21–30)
Lab: 24 mg/dL (ref 7–25)
Lab: 3.4 MMOL/L — ABNORMAL LOW (ref 3.5–5.1)
Lab: 8.5 mg/dL (ref 8.5–10.6)
Lab: >60 mL/min (ref >60)
Lab: >60 mL/min (ref >60)
Lab: 105 MMOL/L (ref 98–110)
Lab: 136 MMOL/L — ABNORMAL LOW (ref 137–147)
Lab: 6 (ref 3–12)

## 2017-08-19 LAB — PHOSPHORUS
Lab: 3 mg/dL (ref 2.0–4.5)
Lab: 3.7 mg/dL (ref 2.0–4.5)

## 2017-08-19 LAB — MAGNESIUM
Lab: 1.4 mg/dL — ABNORMAL LOW (ref 1.6–2.6)
Lab: 2.3 mg/dL (ref 1.6–2.6)

## 2017-08-19 LAB — CBC
Lab: 13 K/UL — ABNORMAL HIGH (ref 4.5–11.0)
Lab: 3.1 M/UL — ABNORMAL LOW (ref 4.4–5.5)
Lab: 11 10*3/uL — ABNORMAL HIGH (ref 4.5–11.0)
Lab: 14 % (ref 11–15)
Lab: 226 10*3/uL (ref 150–400)
Lab: 28 % — ABNORMAL LOW (ref 40–50)
Lab: 3.1 M/UL — ABNORMAL LOW (ref 4.4–5.5)
Lab: 30 pg (ref >60)
Lab: 33 g/dL (ref 32.0–36.0)
Lab: 7.9 FL (ref 7–11)
Lab: 89 FL (ref >60)

## 2017-08-19 LAB — POC GLUCOSE
Lab: 173 mg/dL — ABNORMAL HIGH (ref 70–100)
Lab: 171 mg/dL — ABNORMAL HIGH (ref >60)
Lab: 110 mg/dL — ABNORMAL HIGH (ref 70–100)
Lab: 154 mg/dL — ABNORMAL HIGH (ref 70–100)
Lab: 139 mg/dL — ABNORMAL HIGH (ref 70–100)

## 2017-08-19 MED ORDER — MAGNESIUM SULFATE IN WATER 4 GRAM/50 ML (8 %) IV PGBK
4 g | Freq: Once | INTRAVENOUS | 0 refills | Status: CP
Start: 2017-08-19 — End: ?
  Administered 2017-08-19: 09:00:00 4 g via INTRAVENOUS

## 2017-08-19 MED ORDER — ENOXAPARIN 30 MG/0.3 ML SC SYRG
30 mg | Freq: Two times a day (BID) | SUBCUTANEOUS | 0 refills | Status: DC
Start: 2017-08-19 — End: 2017-08-23
  Administered 2017-08-19 – 2017-08-23 (×8): 30 mg via SUBCUTANEOUS

## 2017-08-19 MED ORDER — ACETAMINOPHEN 325 MG PO TAB
650 mg | ORAL | 0 refills | Status: DC | PRN
Start: 2017-08-19 — End: 2017-08-19

## 2017-08-19 MED ORDER — POTASSIUM CHLORIDE 20 MEQ PO TBTQ
60 meq | Freq: Once | ORAL | 0 refills | Status: CP
Start: 2017-08-19 — End: ?
  Administered 2017-08-19: 08:00:00 60 meq via ORAL

## 2017-08-19 MED ORDER — ATORVASTATIN 40 MG PO TAB
40 mg | Freq: Every day | ORAL | 0 refills | Status: DC
Start: 2017-08-19 — End: 2017-08-23
  Administered 2017-08-19 – 2017-08-23 (×5): 40 mg via ORAL

## 2017-08-19 MED ORDER — CARVEDILOL 3.125 MG PO TAB
3.125 mg | Freq: Two times a day (BID) | ORAL | 0 refills | Status: DC
Start: 2017-08-19 — End: 2017-08-23
  Administered 2017-08-19 – 2017-08-23 (×8): 3.125 mg via ORAL

## 2017-08-19 MED ORDER — INSULIN ASPART 100 UNIT/ML SC FLEXPEN
0-7 [IU] | Freq: Every day | SUBCUTANEOUS | 0 refills | Status: DC
Start: 2017-08-19 — End: 2017-08-23
  Administered 2017-08-19: 18:00:00 1 [IU] via SUBCUTANEOUS

## 2017-08-19 MED ORDER — ONDANSETRON HCL (PF) 4 MG/2 ML IJ SOLN
4-8 mg | INTRAVENOUS | 0 refills | Status: DC | PRN
Start: 2017-08-19 — End: 2017-08-23

## 2017-08-19 MED ORDER — ENOXAPARIN 40 MG/0.4 ML SC SYRG
40 mg | Freq: Every day | SUBCUTANEOUS | 0 refills | Status: DC
Start: 2017-08-19 — End: 2017-08-19

## 2017-08-19 MED ORDER — FENTANYL CITRATE (PF) 50 MCG/ML IJ SOLN
25-50 ug | INTRAVENOUS | 0 refills | Status: DC | PRN
Start: 2017-08-19 — End: 2017-08-19

## 2017-08-19 MED ORDER — CITALOPRAM 20 MG PO TAB
20 mg | Freq: Every morning | ORAL | 0 refills | Status: DC
Start: 2017-08-19 — End: 2017-08-23
  Administered 2017-08-19 – 2017-08-23 (×5): 20 mg via ORAL

## 2017-08-19 MED ORDER — OXYCODONE 5 MG PO TAB
5-10 mg | ORAL | 0 refills | Status: DC | PRN
Start: 2017-08-19 — End: 2017-08-19

## 2017-08-20 ENCOUNTER — Inpatient Hospital Stay: Admit: 2017-08-20 | Discharge: 2017-08-20 | Payer: MEDICARE

## 2017-08-20 ENCOUNTER — Encounter: Admit: 2017-08-20 | Discharge: 2017-08-20 | Payer: MEDICARE

## 2017-08-20 DIAGNOSIS — S72001A Fracture of unspecified part of neck of right femur, initial encounter for closed fracture: Principal | ICD-10-CM

## 2017-08-20 LAB — POC POTASSIUM
Lab: 4 MMOL/L (ref 3.5–5.1)
Lab: 4.6 MMOL/L (ref 3.5–5.1)

## 2017-08-20 LAB — CBC
Lab: 11 K/UL — ABNORMAL HIGH (ref >60)
Lab: 14 % (ref 11–15)
Lab: 17 10*3/uL — ABNORMAL HIGH (ref 4.5–11.0)
Lab: 192 10*3/uL (ref 150–400)
Lab: 2.8 M/UL — ABNORMAL LOW (ref 4.4–5.5)
Lab: 26 % — ABNORMAL LOW (ref 40–50)
Lab: 30 pg (ref 26–34)
Lab: 33 g/dL (ref 32.0–36.0)
Lab: 7.8 FL (ref 7–11)
Lab: 8.8 g/dL — ABNORMAL LOW (ref 13.5–16.5)
Lab: 91 FL (ref 80–100)

## 2017-08-20 LAB — POC GLUCOSE
Lab: 118 mg/dL — ABNORMAL HIGH (ref 70–100)
Lab: 134 mg/dL — ABNORMAL HIGH (ref 70–100)
Lab: 140 mg/dL — ABNORMAL HIGH (ref 70–100)
Lab: 175 mg/dL — ABNORMAL HIGH (ref 70–100)
Lab: 210 mg/dL — ABNORMAL HIGH (ref 70–100)
Lab: 214 mg/dL — ABNORMAL HIGH (ref 70–100)

## 2017-08-20 LAB — BASIC METABOLIC PANEL
Lab: 136 MMOL/L — ABNORMAL LOW (ref 137–147)
Lab: 4.3 MMOL/L (ref 3.5–5.1)

## 2017-08-20 LAB — POC BLOOD GAS ARTERIAL
Lab: 162 mmHg — ABNORMAL HIGH (ref 80–100)
Lab: 21 MMOL/L (ref 21–28)
Lab: 4 MMOL/L
Lab: 40 mmHg (ref 35–45)
Lab: 7.3 — ABNORMAL LOW (ref 7.35–7.45)
Lab: 99 % (ref 95–99)
Lab: 174 mmHg — ABNORMAL HIGH (ref 80–100)
Lab: 21 MMOL/L (ref 21–28)
Lab: 39 mmHg (ref 35–45)
Lab: 4 MMOL/L
Lab: 7.3 (ref 7.35–7.45)
Lab: 99 % (ref 95–99)

## 2017-08-20 LAB — IRON + BINDING CAPACITY + %SAT+ FERRITIN: Lab: 22 ug/dL — ABNORMAL LOW (ref 50–185)

## 2017-08-20 LAB — POC HEMATOCRIT
Lab: 17 % — ABNORMAL LOW (ref 40–50)
Lab: 5.8 g/dL — CL (ref 13.5–16.5)
Lab: 25 % — ABNORMAL LOW (ref 40–50)
Lab: 8.5 g/dL — ABNORMAL LOW (ref 13.5–16.5)

## 2017-08-20 LAB — VITAMIN B12: Lab: 188 pg/mL — ABNORMAL LOW (ref >60)

## 2017-08-20 LAB — POC SODIUM
Lab: 135 MMOL/L — ABNORMAL LOW (ref 137–147)
Lab: 135 MMOL/L — ABNORMAL LOW (ref 137–147)

## 2017-08-20 LAB — PHOSPHORUS: Lab: 3.8 mg/dL — ABNORMAL LOW (ref 2.0–4.5)

## 2017-08-20 LAB — POC IONIZED CALCIUM
Lab: 1.1 MMOL/L (ref 1.0–1.3)
Lab: 1 MMOL/L (ref 1.0–1.3)

## 2017-08-20 LAB — HEMOGLOBIN A1C: Lab: 5.9 % — ABNORMAL LOW (ref 4.0–6.0)

## 2017-08-20 LAB — MAGNESIUM: Lab: 1.8 mg/dL — ABNORMAL LOW (ref 1.6–2.6)

## 2017-08-20 MED ORDER — CEFAZOLIN INJ 1GM IVP
2 g | INTRAVENOUS | 0 refills | Status: CP
Start: 2017-08-20 — End: ?
  Administered 2017-08-21 (×2): 2 g via INTRAVENOUS

## 2017-08-20 MED ORDER — FENTANYL CITRATE (PF) 50 MCG/ML IJ SOLN
25 ug | INTRAVENOUS | 0 refills | Status: DC | PRN
Start: 2017-08-20 — End: 2017-08-20
  Administered 2017-08-20 (×4): 25 ug via INTRAVENOUS

## 2017-08-20 MED ORDER — HYDROMORPHONE (PF) 2 MG/ML IJ SYRG
.5 mg | INTRAVENOUS | 0 refills | Status: DC | PRN
Start: 2017-08-20 — End: 2017-08-20

## 2017-08-20 MED ORDER — OXYCODONE 5 MG PO TAB
5-10 mg | Freq: Once | ORAL | 0 refills | Status: CP | PRN
Start: 2017-08-20 — End: ?
  Administered 2017-08-20: 21:00:00 10 mg via ORAL

## 2017-08-20 MED ORDER — SUGAMMADEX 100 MG/ML IV SOLN
INTRAVENOUS | 0 refills | Status: DC
Start: 2017-08-20 — End: 2017-08-20
  Administered 2017-08-20: 20:00:00 230 mg via INTRAVENOUS

## 2017-08-20 MED ORDER — CLONIDINE-EPINEPHRINE-KETOROLAC-ROPIVACAINE INJECTION 100 ML
Freq: Once | 0 refills | Status: AC
Start: 2017-08-20 — End: ?

## 2017-08-20 MED ORDER — TRANEXAMIC ACID 1,000 MG/10 ML (100 MG/ML) IV SOLN
0 refills | Status: DC
Start: 2017-08-20 — End: 2017-08-20
  Administered 2017-08-20: 20:00:00 1 mL via TOPICAL

## 2017-08-20 MED ORDER — PHENYLEPHRINE IN 0.9% NACL(PF) 1 MG/10 ML (100 MCG/ML) IV SYRG
INTRAVENOUS | 0 refills | Status: DC
Start: 2017-08-20 — End: 2017-08-20
  Administered 2017-08-20 (×15): 100 ug via INTRAVENOUS

## 2017-08-20 MED ORDER — LIDOCAINE (PF) 200 MG/10 ML (2 %) IJ SYRG
0 refills | Status: DC
Start: 2017-08-20 — End: 2017-08-20
  Administered 2017-08-20: 16:00:00 90 mg via INTRAVENOUS

## 2017-08-20 MED ORDER — TRANEXAMIC ACID 1 G IVPB
0 refills | Status: DC
Start: 2017-08-20 — End: 2017-08-20
  Administered 2017-08-20 (×2): 1 g via INTRAVENOUS

## 2017-08-20 MED ORDER — ACETAMINOPHEN 325 MG PO TAB
650 mg | Freq: Once | ORAL | 0 refills | Status: CP
Start: 2017-08-20 — End: ?
  Administered 2017-08-20: 16:00:00 650 mg via ORAL

## 2017-08-20 MED ORDER — DEXTRAN 70-HYPROMELLOSE (PF) 0.1-0.3 % OP DPET
0 refills | Status: DC
Start: 2017-08-20 — End: 2017-08-20
  Administered 2017-08-20: 16:00:00 1 [drp] via OPHTHALMIC

## 2017-08-20 MED ORDER — TRANEXAMIC ACID 1G/60ML NS IVPB (VIAL2BAG)
1 g | Freq: Once | INTRAVENOUS | 0 refills | Status: AC
Start: 2017-08-20 — End: ?

## 2017-08-20 MED ORDER — VANCOMYCIN 1,000 MG IV SOLR
0 refills | Status: DC
Start: 2017-08-20 — End: 2017-08-20
  Administered 2017-08-20: 20:00:00 1 g via TOPICAL

## 2017-08-20 MED ORDER — POLYETHYLENE GLYCOL 3350 17 GRAM PO PWPK
1 | Freq: Every day | ORAL | 0 refills | Status: DC
Start: 2017-08-20 — End: 2017-08-23
  Administered 2017-08-21 – 2017-08-22 (×3): 17 g via ORAL

## 2017-08-20 MED ORDER — CEFAZOLIN 1 GRAM IJ SOLR
0 refills | Status: DC
Start: 2017-08-20 — End: 2017-08-20
  Administered 2017-08-20 (×2): 2 g via INTRAVENOUS

## 2017-08-20 MED ORDER — OXYCODONE 5 MG PO TAB
10 mg | Freq: Once | ORAL | 0 refills | Status: CP
Start: 2017-08-20 — End: ?
  Administered 2017-08-20: 16:00:00 10 mg via ORAL

## 2017-08-20 MED ORDER — HYDROMORPHONE 2 MG/ML IJ SOLN
0 refills | Status: DC
Start: 2017-08-20 — End: 2017-08-20
  Administered 2017-08-20 (×4): .2 mg via INTRAVENOUS

## 2017-08-20 MED ORDER — ONDANSETRON HCL (PF) 4 MG/2 ML IJ SOLN
INTRAVENOUS | 0 refills | Status: DC
Start: 2017-08-20 — End: 2017-08-20
  Administered 2017-08-20: 20:00:00 4 mg via INTRAVENOUS

## 2017-08-20 MED ORDER — SENNOSIDES-DOCUSATE SODIUM 8.6-50 MG PO TAB
1 | Freq: Two times a day (BID) | ORAL | 0 refills | Status: DC
Start: 2017-08-20 — End: 2017-08-23
  Administered 2017-08-21 – 2017-08-23 (×6): 1 via ORAL

## 2017-08-20 MED ORDER — DEXAMETHASONE SODIUM PHOSPHATE 4 MG/ML IJ SOLN
INTRAVENOUS | 0 refills | Status: DC
Start: 2017-08-20 — End: 2017-08-20
  Administered 2017-08-20: 16:00:00 4 mg via INTRAVENOUS

## 2017-08-20 MED ORDER — SODIUM CHLORIDE 0.9 % IV SOLP
INTRAVENOUS | 0 refills | Status: DC
Start: 2017-08-20 — End: 2017-08-21

## 2017-08-20 MED ORDER — ELECTROLYTE-A IV SOLP
0 refills | Status: DC
Start: 2017-08-20 — End: 2017-08-20
  Administered 2017-08-20 (×3): via INTRAVENOUS

## 2017-08-20 MED ORDER — PROPOFOL INJ 10 MG/ML IV VIAL
0 refills | Status: DC
Start: 2017-08-20 — End: 2017-08-20
  Administered 2017-08-20: 16:00:00 170 mg via INTRAVENOUS

## 2017-08-20 MED ORDER — FENTANYL CITRATE (PF) 50 MCG/ML IJ SOLN
0 refills | Status: DC
Start: 2017-08-20 — End: 2017-08-20
  Administered 2017-08-20: 16:00:00 100 ug via INTRAVENOUS

## 2017-08-20 MED ORDER — ROCURONIUM 10 MG/ML IV SOLN
INTRAVENOUS | 0 refills | Status: DC
Start: 2017-08-20 — End: 2017-08-20
  Administered 2017-08-20: 20:00:00 5 mg via INTRAVENOUS
  Administered 2017-08-20 (×3): 10 mg via INTRAVENOUS
  Administered 2017-08-20: 16:00:00 50 mg via INTRAVENOUS
  Administered 2017-08-20 (×2): 10 mg via INTRAVENOUS

## 2017-08-20 MED ORDER — MIDAZOLAM 1 MG/ML IJ SOLN
INTRAVENOUS | 0 refills | Status: DC
Start: 2017-08-20 — End: 2017-08-20

## 2017-08-20 MED ORDER — HALOPERIDOL LACTATE 5 MG/ML IJ SOLN
1 mg | Freq: Once | INTRAVENOUS | 0 refills | Status: DC | PRN
Start: 2017-08-20 — End: 2017-08-20

## 2017-08-20 MED ORDER — PHENYLEPHRINE IV DRIP (STD CONC)
0 refills | Status: DC
Start: 2017-08-20 — End: 2017-08-20
  Administered 2017-08-20 (×2): 0.2 ug/kg/min via INTRAVENOUS

## 2017-08-20 MED ADMIN — SODIUM CHLORIDE 0.9 % IV SOLP [27838]: INTRAVENOUS | @ 15:00:00 | Stop: 2017-08-20 | NDC 00338004904

## 2017-08-21 LAB — PHOSPHORUS: Lab: 4.9 mg/dL — ABNORMAL HIGH (ref 2.0–4.5)

## 2017-08-21 LAB — BASIC METABOLIC PANEL: Lab: 133 MMOL/L — ABNORMAL LOW (ref 137–147)

## 2017-08-21 LAB — CBC: Lab: 17 10*3/uL — ABNORMAL HIGH (ref 4.5–11.0)

## 2017-08-21 LAB — POC GLUCOSE
Lab: 245 mg/dL — ABNORMAL HIGH (ref 70–100)
Lab: 201 mg/dL — ABNORMAL HIGH (ref 70–100)
Lab: 187 mg/dL — ABNORMAL HIGH (ref 70–100)
Lab: 168 mg/dL — ABNORMAL HIGH (ref 70–100)
Lab: 155 mg/dL — ABNORMAL HIGH (ref 70–100)

## 2017-08-21 LAB — MAGNESIUM: Lab: 1.8 mg/dL — ABNORMAL LOW (ref 1.6–2.6)

## 2017-08-21 MED ORDER — METHOCARBAMOL 750 MG PO TAB
750 mg | Freq: Two times a day (BID) | ORAL | 0 refills | Status: DC
Start: 2017-08-21 — End: 2017-08-23
  Administered 2017-08-21 – 2017-08-23 (×5): 750 mg via ORAL

## 2017-08-21 MED ORDER — SODIUM CHLORIDE 0.9 % IV SOLP
INTRAVENOUS | 0 refills | Status: CP
Start: 2017-08-21 — End: ?
  Administered 2017-08-21: 18:00:00 1000.000 mL via INTRAVENOUS

## 2017-08-21 MED ORDER — BACITRACIN ZINC 500 UNIT/GRAM TP OINT
Freq: Two times a day (BID) | TOPICAL | 0 refills | Status: DC
Start: 2017-08-21 — End: 2017-08-23
  Administered 2017-08-22 (×2): via TOPICAL

## 2017-08-22 ENCOUNTER — Encounter: Admit: 2017-08-22 | Discharge: 2017-08-22 | Payer: MEDICARE

## 2017-08-22 DIAGNOSIS — E669 Obesity, unspecified: ICD-10-CM

## 2017-08-22 DIAGNOSIS — E119 Type 2 diabetes mellitus without complications: ICD-10-CM

## 2017-08-22 DIAGNOSIS — F329 Major depressive disorder, single episode, unspecified: ICD-10-CM

## 2017-08-22 DIAGNOSIS — I4892 Unspecified atrial flutter: ICD-10-CM

## 2017-08-22 DIAGNOSIS — M1990 Unspecified osteoarthritis, unspecified site: ICD-10-CM

## 2017-08-22 DIAGNOSIS — R079 Chest pain, unspecified: ICD-10-CM

## 2017-08-22 DIAGNOSIS — F419 Anxiety disorder, unspecified: ICD-10-CM

## 2017-08-22 DIAGNOSIS — E785 Hyperlipidemia, unspecified: ICD-10-CM

## 2017-08-22 DIAGNOSIS — Z8669 Personal history of other diseases of the nervous system and sense organs: ICD-10-CM

## 2017-08-22 DIAGNOSIS — N189 Chronic kidney disease, unspecified: ICD-10-CM

## 2017-08-22 DIAGNOSIS — Z9889 Other specified postprocedural states: ICD-10-CM

## 2017-08-22 DIAGNOSIS — I4891 Unspecified atrial fibrillation: ICD-10-CM

## 2017-08-22 DIAGNOSIS — I10 Essential (primary) hypertension: ICD-10-CM

## 2017-08-22 LAB — CBC
Lab: 14 K/UL — ABNORMAL HIGH (ref >60)
Lab: 14 % (ref 11–15)
Lab: 14 10*3/uL — ABNORMAL HIGH (ref 4.5–11.0)
Lab: 19 % — ABNORMAL LOW (ref 40–50)
Lab: 2.1 M/UL — ABNORMAL LOW (ref 4.4–5.5)
Lab: 227 10*3/uL (ref 150–400)
Lab: 31 pg (ref 26–34)
Lab: 34 g/dL (ref 32.0–36.0)
Lab: 6.6 g/dL — ABNORMAL LOW (ref 13.5–16.5)
Lab: 7.6 FL (ref 7–11)
Lab: 90 FL (ref 80–100)

## 2017-08-22 LAB — BASIC METABOLIC PANEL: Lab: 132 MMOL/L — ABNORMAL LOW (ref >60)

## 2017-08-22 LAB — POC GLUCOSE
Lab: 183 mg/dL — ABNORMAL HIGH (ref 70–100)
Lab: 153 mg/dL — ABNORMAL HIGH (ref 70–100)
Lab: 139 mg/dL — ABNORMAL HIGH (ref 70–100)
Lab: 135 mg/dL — ABNORMAL HIGH (ref 70–100)
Lab: 141 mg/dL — ABNORMAL HIGH (ref 70–100)

## 2017-08-22 LAB — PHOSPHORUS: Lab: 2.2 mg/dL — ABNORMAL LOW (ref >60)

## 2017-08-22 LAB — MAGNESIUM: Lab: 1.8 mg/dL — ABNORMAL LOW (ref >60)

## 2017-08-22 MED ORDER — POLYETHYLENE GLYCOL 3350 17 GRAM PO PWPK
1 | Freq: Two times a day (BID) | ORAL | 0 refills | Status: DC
Start: 2017-08-22 — End: 2017-08-23
  Administered 2017-08-23 (×2): 17 g via ORAL

## 2017-08-22 MED ORDER — FUROSEMIDE 10 MG/ML IJ SOLN
40 mg | Freq: Once | INTRAVENOUS | 0 refills | Status: CP
Start: 2017-08-22 — End: ?
  Administered 2017-08-22: 16:00:00 40 mg via INTRAVENOUS

## 2017-08-22 MED ORDER — BISACODYL 10 MG RE SUPP
10 mg | Freq: Once | RECTAL | 0 refills | Status: CP
Start: 2017-08-22 — End: ?
  Administered 2017-08-22: 22:00:00 10 mg via RECTAL

## 2017-08-22 MED ORDER — MAGNESIUM SULFATE IN D5W 1 GRAM/100 ML IV PGBK
1 g | Freq: Once | INTRAVENOUS | 0 refills | Status: CP
Start: 2017-08-22 — End: ?
  Administered 2017-08-22: 13:00:00 1 g via INTRAVENOUS

## 2017-08-22 MED ORDER — OXYCODONE 5 MG PO TAB
5-15 mg | ORAL | 0 refills | Status: DC | PRN
Start: 2017-08-22 — End: 2017-08-23
  Administered 2017-08-22 (×2): 15 mg via ORAL
  Administered 2017-08-23: 15:00:00 10 mg via ORAL

## 2017-08-22 MED ORDER — ACETAMINOPHEN 325 MG PO TAB
650 mg | ORAL | 0 refills | Status: DC | PRN
Start: 2017-08-22 — End: 2017-08-23
  Administered 2017-08-22 – 2017-08-23 (×3): 650 mg via ORAL

## 2017-08-23 ENCOUNTER — Encounter: Admit: 2017-08-18 | Discharge: 2017-08-18 | Payer: MEDICARE

## 2017-08-23 ENCOUNTER — Encounter: Admit: 2017-08-19 | Discharge: 2017-08-19 | Payer: MEDICARE

## 2017-08-23 ENCOUNTER — Inpatient Hospital Stay: Admit: 2017-08-20 | Discharge: 2017-08-20 | Payer: MEDICARE

## 2017-08-23 ENCOUNTER — Inpatient Hospital Stay
Admit: 2017-08-19 | Discharge: 2017-08-23 | Disposition: A | Discharge status: Skilled Nursing Facility | Payer: MEDICARE | Source: Other Acute Inpatient Hospital

## 2017-08-23 ENCOUNTER — Encounter: Admit: 2017-08-23 | Discharge: 2017-08-23 | Payer: MEDICARE

## 2017-08-23 DIAGNOSIS — R402253 Coma scale, best verbal response, oriented, at hospital admission: ICD-10-CM

## 2017-08-23 DIAGNOSIS — E1122 Type 2 diabetes mellitus with diabetic chronic kidney disease: ICD-10-CM

## 2017-08-23 DIAGNOSIS — F329 Major depressive disorder, single episode, unspecified: ICD-10-CM

## 2017-08-23 DIAGNOSIS — R269 Unspecified abnormalities of gait and mobility: ICD-10-CM

## 2017-08-23 DIAGNOSIS — K5900 Constipation, unspecified: ICD-10-CM

## 2017-08-23 DIAGNOSIS — R402143 Coma scale, eyes open, spontaneous, at hospital admission: ICD-10-CM

## 2017-08-23 DIAGNOSIS — R402363 Coma scale, best motor response, obeys commands, at hospital admission: ICD-10-CM

## 2017-08-23 DIAGNOSIS — R54 Age-related physical debility: ICD-10-CM

## 2017-08-23 DIAGNOSIS — N183 Chronic kidney disease, stage 3 (moderate): ICD-10-CM

## 2017-08-23 DIAGNOSIS — I2510 Atherosclerotic heart disease of native coronary artery without angina pectoris: ICD-10-CM

## 2017-08-23 DIAGNOSIS — G8911 Acute pain due to trauma: ICD-10-CM

## 2017-08-23 DIAGNOSIS — T84029A Dislocation of unspecified internal joint prosthesis, initial encounter: ICD-10-CM

## 2017-08-23 DIAGNOSIS — D62 Acute posthemorrhagic anemia: ICD-10-CM

## 2017-08-23 DIAGNOSIS — E785 Hyperlipidemia, unspecified: ICD-10-CM

## 2017-08-23 DIAGNOSIS — E669 Obesity, unspecified: ICD-10-CM

## 2017-08-23 DIAGNOSIS — M9701XA Periprosthetic fracture around internal prosthetic right hip joint, initial encounter: ICD-10-CM

## 2017-08-23 DIAGNOSIS — E871 Hypo-osmolality and hyponatremia: ICD-10-CM

## 2017-08-23 DIAGNOSIS — I129 Hypertensive chronic kidney disease with stage 1 through stage 4 chronic kidney disease, or unspecified chronic kidney disease: ICD-10-CM

## 2017-08-23 DIAGNOSIS — F419 Anxiety disorder, unspecified: ICD-10-CM

## 2017-08-23 DIAGNOSIS — S72001A Fracture of unspecified part of neck of right femur, initial encounter for closed fracture: Principal | ICD-10-CM

## 2017-08-23 DIAGNOSIS — Z6832 Body mass index (BMI) 32.0-32.9, adult: ICD-10-CM

## 2017-08-23 DIAGNOSIS — N179 Acute kidney failure, unspecified: ICD-10-CM

## 2017-08-23 LAB — POC GLUCOSE
Lab: 168 mg/dL — ABNORMAL HIGH (ref 70–100)
Lab: 143 mg/dL — ABNORMAL HIGH (ref 70–100)
Lab: 153 mg/dL — ABNORMAL HIGH (ref 70–100)

## 2017-08-23 LAB — CBC
Lab: 14 % (ref 11–15)
Lab: 16 10*3/uL — ABNORMAL HIGH (ref 4.5–11.0)
Lab: 2.7 M/UL — ABNORMAL LOW (ref 4.4–5.5)
Lab: 23 % — ABNORMAL LOW (ref 40–50)
Lab: 261 10*3/uL (ref 150–400)
Lab: 30 pg (ref 26–34)
Lab: 35 g/dL (ref 32.0–36.0)
Lab: 7.4 FL (ref 7–11)
Lab: 8.3 g/dL — ABNORMAL LOW (ref 13.5–16.5)
Lab: 87 FL (ref 80–100)

## 2017-08-23 MED ORDER — MAGNESIUM HYDROXIDE 2,400 MG/10 ML PO SUSP
10 mL | Freq: Every day | ORAL | 0 refills | Status: DC | PRN
Start: 2017-08-23 — End: 2017-08-23
  Administered 2017-08-23: 13:00:00 10 mL via ORAL

## 2017-08-23 MED ORDER — OXYCODONE 5 MG PO TAB
5-15 mg | ORAL_TABLET | ORAL | 0 refills | 6.00000 days | Status: AC | PRN
Start: 2017-08-23 — End: 2018-09-01

## 2017-08-23 MED ORDER — MAGNESIUM HYDROXIDE 2,400 MG/10 ML PO SUSP
10 mL | Freq: Every day | ORAL | 0 refills | 2.00000 days | Status: AC | PRN
Start: 2017-08-23 — End: 2018-02-10

## 2017-08-23 MED ORDER — CALCIUM CARBONATE 200 MG CALCIUM (500 MG) PO CHEW
500 mg | ORAL | 0 refills | Status: DC | PRN
Start: 2017-08-23 — End: 2017-08-23
  Administered 2017-08-23: 08:00:00 500 mg via ORAL

## 2017-08-23 MED ORDER — POLYETHYLENE GLYCOL 3350 17 GRAM PO PWPK
17 g | Freq: Two times a day (BID) | ORAL | 0 refills | 18.00000 days | Status: AC
Start: 2017-08-23 — End: 2018-02-10

## 2017-08-23 MED ORDER — CYCLOBENZAPRINE 5 MG PO TAB
5 mg | ORAL_TABLET | Freq: Three times a day (TID) | ORAL | 0 refills | 30.00000 days | Status: AC
Start: 2017-08-23 — End: 2018-09-01
  Filled 2017-08-23: qty 90, 30d supply

## 2017-08-23 MED ORDER — BISACODYL 10 MG RE SUPP
10 mg | Freq: Once | RECTAL | 0 refills | Status: CP
Start: 2017-08-23 — End: ?
  Administered 2017-08-23: 13:00:00 10 mg via RECTAL

## 2017-09-02 ENCOUNTER — Encounter: Admit: 2017-09-02 | Discharge: 2017-09-02 | Payer: MEDICARE

## 2017-09-03 ENCOUNTER — Encounter: Admit: 2017-09-03 | Discharge: 2017-09-03 | Payer: MEDICARE

## 2017-09-09 ENCOUNTER — Encounter: Admit: 2017-09-09 | Discharge: 2017-09-09 | Payer: MEDICARE

## 2017-09-09 ENCOUNTER — Ambulatory Visit: Admit: 2017-09-09 | Discharge: 2017-09-10 | Payer: MEDICARE

## 2017-09-09 DIAGNOSIS — F329 Major depressive disorder, single episode, unspecified: ICD-10-CM

## 2017-09-09 DIAGNOSIS — I10 Essential (primary) hypertension: ICD-10-CM

## 2017-09-09 DIAGNOSIS — N189 Chronic kidney disease, unspecified: ICD-10-CM

## 2017-09-09 DIAGNOSIS — E119 Type 2 diabetes mellitus without complications: ICD-10-CM

## 2017-09-09 DIAGNOSIS — I4891 Unspecified atrial fibrillation: ICD-10-CM

## 2017-09-09 DIAGNOSIS — Z9889 Other specified postprocedural states: ICD-10-CM

## 2017-09-09 DIAGNOSIS — R079 Chest pain, unspecified: ICD-10-CM

## 2017-09-09 DIAGNOSIS — E669 Obesity, unspecified: ICD-10-CM

## 2017-09-09 DIAGNOSIS — E785 Hyperlipidemia, unspecified: ICD-10-CM

## 2017-09-09 DIAGNOSIS — M1990 Unspecified osteoarthritis, unspecified site: ICD-10-CM

## 2017-09-09 DIAGNOSIS — Z8669 Personal history of other diseases of the nervous system and sense organs: ICD-10-CM

## 2017-09-09 DIAGNOSIS — F419 Anxiety disorder, unspecified: ICD-10-CM

## 2017-09-09 DIAGNOSIS — I4892 Unspecified atrial flutter: ICD-10-CM

## 2017-09-10 DIAGNOSIS — Z9889 Other specified postprocedural states: Principal | ICD-10-CM

## 2017-09-25 ENCOUNTER — Encounter: Admit: 2017-09-25 | Discharge: 2017-09-25 | Payer: MEDICARE

## 2017-09-25 DIAGNOSIS — Z96649 Presence of unspecified artificial hip joint: Principal | ICD-10-CM

## 2017-09-30 ENCOUNTER — Encounter: Admit: 2017-09-30 | Discharge: 2017-09-30 | Payer: MEDICARE

## 2017-09-30 ENCOUNTER — Ambulatory Visit: Admit: 2017-09-30 | Discharge: 2017-09-30 | Payer: MEDICARE

## 2017-09-30 DIAGNOSIS — E669 Obesity, unspecified: ICD-10-CM

## 2017-09-30 DIAGNOSIS — F329 Major depressive disorder, single episode, unspecified: ICD-10-CM

## 2017-09-30 DIAGNOSIS — I4891 Unspecified atrial fibrillation: ICD-10-CM

## 2017-09-30 DIAGNOSIS — Z8669 Personal history of other diseases of the nervous system and sense organs: ICD-10-CM

## 2017-09-30 DIAGNOSIS — E119 Type 2 diabetes mellitus without complications: ICD-10-CM

## 2017-09-30 DIAGNOSIS — I10 Essential (primary) hypertension: Secondary | ICD-10-CM

## 2017-09-30 DIAGNOSIS — Z9889 Other specified postprocedural states: ICD-10-CM

## 2017-09-30 DIAGNOSIS — M978XXD Periprosthetic fracture around other internal prosthetic joint, subsequent encounter: ICD-10-CM

## 2017-09-30 DIAGNOSIS — E785 Hyperlipidemia, unspecified: ICD-10-CM

## 2017-09-30 DIAGNOSIS — R079 Chest pain, unspecified: ICD-10-CM

## 2017-09-30 DIAGNOSIS — Z96649 Presence of unspecified artificial hip joint: Principal | ICD-10-CM

## 2017-09-30 DIAGNOSIS — I4892 Unspecified atrial flutter: ICD-10-CM

## 2017-09-30 DIAGNOSIS — N189 Chronic kidney disease, unspecified: ICD-10-CM

## 2017-09-30 DIAGNOSIS — M1990 Unspecified osteoarthritis, unspecified site: ICD-10-CM

## 2017-09-30 DIAGNOSIS — F419 Anxiety disorder, unspecified: ICD-10-CM

## 2017-11-08 ENCOUNTER — Encounter: Admit: 2017-11-08 | Discharge: 2017-11-08 | Payer: MEDICARE

## 2017-11-08 DIAGNOSIS — Z96641 Presence of right artificial hip joint: Principal | ICD-10-CM

## 2017-11-11 ENCOUNTER — Ambulatory Visit: Admit: 2017-11-11 | Discharge: 2017-11-11 | Payer: MEDICARE

## 2017-11-11 DIAGNOSIS — Z96641 Presence of right artificial hip joint: Principal | ICD-10-CM

## 2017-11-11 MED ORDER — TRAMADOL 50 MG PO TAB
50 mg | ORAL_TABLET | ORAL | 0 refills | Status: AC | PRN
Start: 2017-11-11 — End: ?

## 2017-12-10 ENCOUNTER — Ambulatory Visit: Admit: 2017-12-10 | Discharge: 2017-12-11 | Payer: MEDICARE

## 2017-12-10 DIAGNOSIS — I10 Essential (primary) hypertension: Principal | ICD-10-CM

## 2017-12-10 DIAGNOSIS — E785 Hyperlipidemia, unspecified: ICD-10-CM

## 2017-12-26 ENCOUNTER — Encounter: Admit: 2017-12-26 | Discharge: 2017-12-26 | Payer: MEDICARE

## 2017-12-26 DIAGNOSIS — E785 Hyperlipidemia, unspecified: Principal | ICD-10-CM

## 2017-12-27 MED ORDER — ATORVASTATIN 40 MG PO TAB
40 mg | ORAL_TABLET | Freq: Two times a day (BID) | ORAL | 3 refills | Status: AC
Start: 2017-12-27 — End: ?

## 2018-01-15 ENCOUNTER — Encounter: Admit: 2018-01-15 | Discharge: 2018-01-15 | Payer: MEDICARE

## 2018-01-15 MED ORDER — VALSARTAN 160 MG PO TAB
160 mg | ORAL_TABLET | Freq: Every day | ORAL | 3 refills | 60.00000 days | Status: AC
Start: 2018-01-15 — End: 2018-01-16

## 2018-01-16 ENCOUNTER — Encounter: Admit: 2018-01-16 | Discharge: 2018-01-16 | Payer: MEDICARE

## 2018-01-16 MED ORDER — VALSARTAN 160 MG PO TAB
160 mg | ORAL_TABLET | Freq: Every day | ORAL | 3 refills | 60.00000 days | Status: AC
Start: 2018-01-16 — End: 2018-01-27
  Filled 2018-01-16: qty 90, 90d supply

## 2018-01-25 ENCOUNTER — Encounter: Admit: 2018-01-25 | Discharge: 2018-01-25 | Payer: MEDICARE

## 2018-01-26 ENCOUNTER — Encounter: Admit: 2018-01-26 | Discharge: 2018-01-26 | Payer: MEDICARE

## 2018-01-27 ENCOUNTER — Encounter: Admit: 2018-01-27 | Discharge: 2018-01-27 | Payer: MEDICARE

## 2018-01-27 MED ORDER — VALSARTAN 160 MG PO TAB
160 mg | ORAL_TABLET | Freq: Every day | ORAL | 3 refills | 60.00000 days | Status: AC
Start: 2018-01-27 — End: 2018-12-05

## 2018-02-10 ENCOUNTER — Ambulatory Visit: Admit: 2018-02-10 | Discharge: 2018-02-10 | Payer: MEDICARE

## 2018-02-10 ENCOUNTER — Encounter: Admit: 2018-02-10 | Discharge: 2018-02-10 | Payer: MEDICARE

## 2018-02-10 DIAGNOSIS — Z96641 Presence of right artificial hip joint: ICD-10-CM

## 2018-02-10 DIAGNOSIS — E785 Hyperlipidemia, unspecified: ICD-10-CM

## 2018-02-10 DIAGNOSIS — I4891 Unspecified atrial fibrillation: ICD-10-CM

## 2018-02-10 DIAGNOSIS — Z8669 Personal history of other diseases of the nervous system and sense organs: ICD-10-CM

## 2018-02-10 DIAGNOSIS — I4892 Unspecified atrial flutter: ICD-10-CM

## 2018-02-10 DIAGNOSIS — Z9889 Other specified postprocedural states: ICD-10-CM

## 2018-02-10 DIAGNOSIS — N189 Chronic kidney disease, unspecified: ICD-10-CM

## 2018-02-10 DIAGNOSIS — F329 Major depressive disorder, single episode, unspecified: ICD-10-CM

## 2018-02-10 DIAGNOSIS — F419 Anxiety disorder, unspecified: ICD-10-CM

## 2018-02-10 DIAGNOSIS — E119 Type 2 diabetes mellitus without complications: ICD-10-CM

## 2018-02-10 DIAGNOSIS — I10 Essential (primary) hypertension: Secondary | ICD-10-CM

## 2018-02-10 DIAGNOSIS — R079 Chest pain, unspecified: ICD-10-CM

## 2018-02-10 DIAGNOSIS — E669 Obesity, unspecified: ICD-10-CM

## 2018-02-10 DIAGNOSIS — M1990 Unspecified osteoarthritis, unspecified site: ICD-10-CM

## 2018-03-10 ENCOUNTER — Encounter: Admit: 2018-03-10 | Discharge: 2018-03-10 | Payer: MEDICARE

## 2018-03-10 MED ORDER — CARVEDILOL 3.125 MG PO TAB
3.125 mg | ORAL_TABLET | Freq: Two times a day (BID) | ORAL | 2 refills | 90.00000 days | Status: AC
Start: 2018-03-10 — End: 2018-11-26

## 2018-03-10 MED ORDER — HYDROCHLOROTHIAZIDE 25 MG PO TAB
25 mg | ORAL_TABLET | Freq: Every morning | ORAL | 2 refills | 28.00000 days | Status: AC
Start: 2018-03-10 — End: 2019-05-14

## 2018-03-11 ENCOUNTER — Encounter: Admit: 2018-03-11 | Discharge: 2018-03-11 | Payer: MEDICARE

## 2018-03-17 ENCOUNTER — Encounter: Admit: 2018-03-17 | Discharge: 2018-03-17 | Payer: MEDICARE

## 2018-08-04 ENCOUNTER — Encounter: Admit: 2018-08-04 | Discharge: 2018-08-04 | Payer: MEDICARE

## 2018-08-29 ENCOUNTER — Encounter: Admit: 2018-08-29 | Discharge: 2018-08-29

## 2018-08-29 DIAGNOSIS — Z96641 Presence of right artificial hip joint: Secondary | ICD-10-CM

## 2018-08-29 DIAGNOSIS — Z9889 Other specified postprocedural states: Secondary | ICD-10-CM

## 2018-09-01 ENCOUNTER — Encounter: Admit: 2018-09-01 | Discharge: 2018-09-01

## 2018-09-01 ENCOUNTER — Ambulatory Visit: Admit: 2018-09-01 | Discharge: 2018-09-01

## 2018-09-01 DIAGNOSIS — Z9889 Other specified postprocedural states: Secondary | ICD-10-CM

## 2018-09-01 DIAGNOSIS — I10 Essential (primary) hypertension: Secondary | ICD-10-CM

## 2018-09-01 DIAGNOSIS — Z96641 Presence of right artificial hip joint: Secondary | ICD-10-CM

## 2018-09-01 DIAGNOSIS — F419 Anxiety disorder, unspecified: Secondary | ICD-10-CM

## 2018-09-01 DIAGNOSIS — N189 Chronic kidney disease, unspecified: Secondary | ICD-10-CM

## 2018-09-01 DIAGNOSIS — I4892 Unspecified atrial flutter: Secondary | ICD-10-CM

## 2018-09-01 DIAGNOSIS — M1990 Unspecified osteoarthritis, unspecified site: Secondary | ICD-10-CM

## 2018-09-01 DIAGNOSIS — I4891 Unspecified atrial fibrillation: Secondary | ICD-10-CM

## 2018-09-01 DIAGNOSIS — F329 Major depressive disorder, single episode, unspecified: Secondary | ICD-10-CM

## 2018-09-01 DIAGNOSIS — E669 Obesity, unspecified: Secondary | ICD-10-CM

## 2018-09-01 DIAGNOSIS — R079 Chest pain, unspecified: Secondary | ICD-10-CM

## 2018-09-01 DIAGNOSIS — E119 Type 2 diabetes mellitus without complications: Secondary | ICD-10-CM

## 2018-09-01 DIAGNOSIS — Z8669 Personal history of other diseases of the nervous system and sense organs: Secondary | ICD-10-CM

## 2018-09-01 DIAGNOSIS — E785 Hyperlipidemia, unspecified: Secondary | ICD-10-CM

## 2018-09-01 NOTE — Progress Notes
Orthopaedic Post-Operative Visit - [name], MD    Date of Visit: 09/01/18  _______________________________________________  DIAGNOSIS:   s/p ORIF of right femur fracture, Revision THA (08/20/2017)  ???  PROCEDURE:   Left THA (02/28/17)  Right THA (05/28/2017)  ORIF of right femur fracture, Revision THA (08/20/2017)  ???  PLAN:   Overall doing well. No pain to the bilateral hips.  He is using no ambulatory assistive device. No further displacement on radiographs compared to the previous films, however there is significant callus formation and interval healing as well as HO.  He will follow up in 12 months with repeat radiographs at that time.    ________________________________________________    Interval Hx:  Patient returns for routine postoperative visit. Doing well. He is having a minimal discomfort in the right thigh today.  He states he has little pain on a day-to-day basis and is not using any ambulatory assistive device. Overall pleased with his outcome given the circumstances.       PHYSICAL EXAM:  Vitals:    09/01/18 1259   BP: (!) 150/89   Pulse: 74   Resp: 16   Temp: 36.7 ???C (98 ???F)   SpO2: 98%     No tenderness at left greater trochanter  No tenderness to right surgical site  Motor intact with knee flexion/extension and ankle dorsiflexion/plantarflexion bilaterally.  Distal pulses intact, skin is well perfused    Constitutional: In no distress  Eyes: No scleral icterus, conjugate gaze intact  Respiratory: Non labored respirations, no audible wheezing, no coughing  Cardiovascular: Normal rate per peripheral pulse, normal rhythm per peripheral pulse  Lymphatic: No peripheral edema. No asymmetric edema.  Skin: No rashes. No ulcerations  Neurological: Normal sensation to light touch.  No detectable weakness  Psychiatric: Mood is appropriate.  Judgement and insight seems appropriate.    IMAGING: Radiographs of the right hip and femur taken today which demonstrates interval healing at the fracture site.  There has been some displacement of the proximal fragment compared to the postoperative radiographs, but this is stable from radiographs in 11/11/2017.    REVIEW OF SYSTEMS:   12 systems (General, HENT, Eyes, Respiratory, CV, GI, GU, MS, Skin, Neurological, Hematologic, and Psychiatric) were reviewed and documented per the patient intake form completed during today's visit. The review was negative for reported symptoms except those detailed in HPI     PAST MEDICAL HISTORY:   Medical History:   Diagnosis Date   ??? Anxiety 09/12/2010   ??? Arthritis     lower back   ??? Atrial fibrillation (HCC) 09/26/2010    s/p atrial ablation surgery 2012   ??? Atrial flutter (HCC) 02/22/2011    s/p atrial ablation surgery 2012   ??? Chest pain    ??? Chest pain 09/20/2006    09/19/06 Heart Cath:  1. Normal left ventricular size, systolic function and hemodynamics.  2. Anterolateral hypokinesis.  3. 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 Chronic kidney disease     stage 3   ??? Depression 09/12/2010   ??? Diabetes (HCC) 09/12/2010   ??? Diabetes mellitus type II 09/12/2010   ??? HTN (hypertension)    ??? Hx of sleep apnea     does not use CPAP after 70 lb weight loss and sinus issues   ??? Hyperlipidemia 09/20/2006   ??? Hypertension 09/20/2006   ??? Obesity 09/12/2010   ??? S/P ablation of atrial flutter 01/29/2013  2012 by Dr. Wallene Huh       PAST SURGICAL HISTORY:   Surgical History:   Procedure Laterality Date   ??? ATRIAL ABLATION SURGERY  2012   ??? LEFT TOTAL HIP ARTHROPLASTY Left 02/28/2017    Performed by Sula Rumple, MD at [facility] OR   ??? RIGHT TOTAL HIP ARTHROPLASTY Right 05/28/2017    Performed by Sula Rumple[facility] MD at [facility] OR   ??? REVISION ARTHROPLASTY TOTAL HIP Right 08/20/2017    Performed by Sula Rumple, MD at [facility] OR   ??? CARDIAC CATHERIZATION     ??? CHOLECYSTECTOMY     ??? HX TONSILLECTOMY     ??? KNEE ARTHROSCOPY Left        FAMILY HISTORY:   Family History Problem Relation Age of Onset   ??? Hypertension Mother    ??? Diabetes Mother    ??? Stroke Mother        SOCIAL HISTORY:  reports that he quit smoking about 48 years ago. He quit after 4.00 years of use. He has never used smokeless tobacco. He reports that he does not drink alcohol or use drugs.    MEDICATIONS:   Current Outpatient Medications:   ???  acetaminophen (TYLENOL) 500 mg tablet, Take 1,000 mg by mouth every 6 hours as needed for Pain. Max of 4,000 mg of acetaminophen in 24 hours., Disp: , Rfl:   ???  aspirin EC 81 mg tablet, Take one tablet by mouth twice daily. Take with food. (Patient taking differently: Take 81 mg by mouth daily. Take with food.), Disp: 60 tablet, Rfl: 0  ???  atorvastatin (LIPITOR) 40 mg tablet, Take one tablet by mouth twice daily., Disp: 180 tablet, Rfl: 3  ???  carvedilol (COREG) 3.125 mg tablet, Take one tablet by mouth twice daily with meals., Disp: 180 tablet, Rfl: 2  ???  citalopram (CELEXA) 20 mg tablet, Take 20 mg by mouth every morning., Disp: , Rfl:   ???  cyclobenzaprine (FLEXERIL) 5 mg tablet, Take one tablet by mouth three times daily., Disp: 90 tablet, Rfl: 0  ???  fish oil- omega 3-DHA/EPA 300/1,000 mg capsule, Take 1 capsule by mouth twice daily., Disp: , Rfl:   ???  hydroCHLOROthiazide (HYDRODIURIL) 25 mg tablet, TAKE 1 TABLET BY MOUTH  EVERY MORNING, Disp: 90 tablet, Rfl: 2  ???  metFORMIN (GLUCOPHAGE) 1,000 mg tablet, Take 1 Tab by mouth twice daily., Disp: , Rfl:   ???  Multivitamin Cmb No.21-Iron-FA (CENTRUM) 18-400 mg-mcg tab, Take 1 tablet by mouth daily., Disp: , Rfl:   ???  nitroglycerin (NITROSTAT) 0.4 mg tablet, 1 Tab as Needed for Chest Pain., Disp: 25 Tab, Rfl: 1  ???  other medication, Equate No Drip - 1-2 sprays into both nostril daily as needed, Disp: , Rfl:   ???  oxyCODONE (ROXICODONE, OXY-IR) 5 mg tablet, Take one tablet to three tablets by mouth every 3 hours as needed, Disp: 60 tablet, Rfl: 0  ???  potassium chloride SR (KLOR-CON M20) 20 mEq tablet, Take 20 mEq by mouth daily with breakfast., Disp: , Rfl:   ???  senna/docusate (SENOKOT-S) 8.6/50 mg tablet, Take one tablet by mouth twice daily., Disp: 30 tablet, Rfl: 3  ???  traMADol (ULTRAM) 50 mg tablet, Take one tablet by mouth every 6 hours as needed for Pain., Disp: 60 tablet, Rfl: 0  ???  valsartan (DIOVAN) 160 mg tablet, Take one tablet by mouth daily., Disp: 90 tablet, Rfl: 3    ALLERGIES:   Allergies  Allergen Reactions   ??? Adhesive Tape (Rosins) RASH   ??? Ace Inhibitors UNKNOWN   ??? Nsaids (Non-Steroidal Anti-Inflammatory Drug) SEE COMMENTS     Stage 3 kidney disease

## 2018-09-30 ENCOUNTER — Encounter: Admit: 2018-09-30 | Discharge: 2018-09-30

## 2018-09-30 ENCOUNTER — Ambulatory Visit: Admit: 2018-09-30 | Discharge: 2018-10-01

## 2018-09-30 DIAGNOSIS — I4891 Unspecified atrial fibrillation: Secondary | ICD-10-CM

## 2018-09-30 DIAGNOSIS — E669 Obesity, unspecified: Secondary | ICD-10-CM

## 2018-09-30 DIAGNOSIS — F419 Anxiety disorder, unspecified: Secondary | ICD-10-CM

## 2018-09-30 DIAGNOSIS — E785 Hyperlipidemia, unspecified: Secondary | ICD-10-CM

## 2018-09-30 DIAGNOSIS — I10 Essential (primary) hypertension: Secondary | ICD-10-CM

## 2018-09-30 DIAGNOSIS — E119 Type 2 diabetes mellitus without complications: Secondary | ICD-10-CM

## 2018-09-30 DIAGNOSIS — N189 Chronic kidney disease, unspecified: Secondary | ICD-10-CM

## 2018-09-30 DIAGNOSIS — M1990 Unspecified osteoarthritis, unspecified site: Secondary | ICD-10-CM

## 2018-09-30 DIAGNOSIS — R079 Chest pain, unspecified: Secondary | ICD-10-CM

## 2018-09-30 DIAGNOSIS — F329 Major depressive disorder, single episode, unspecified: Secondary | ICD-10-CM

## 2018-09-30 DIAGNOSIS — I4892 Unspecified atrial flutter: Secondary | ICD-10-CM

## 2018-09-30 DIAGNOSIS — Z8669 Personal history of other diseases of the nervous system and sense organs: Secondary | ICD-10-CM

## 2018-09-30 DIAGNOSIS — Z9889 Other specified postprocedural states: Secondary | ICD-10-CM

## 2018-09-30 NOTE — Progress Notes
Date of Service: 09/30/2018    Dan Mccarty is a 73 y.o. male.       HPI      [name] is followed for hypertension, palpitations, paroxysmal atrial flutter, non-obstructive coronary artery disease and diabetes mellitus.???He has convalesced well from his orthopedic procedures of 2018 and 2019 and his ambulatory skills continue to improve.  He can walk several blocks at a time with a slight limp. The patient reports that his blood sugars are now under reasonable control???and that his fasting blood sugars tend to run 120-150 milligrams percent.???Otherwise, over the past 6 months, the patient has been doing well and he reports no angina, congestive symptoms, palpitations, sensation of sustained forceful heart pounding, lightheadedness or syncope. His exercise tolerance has???improved following right hip arthroplasty and continues to improve. The patient reports no myalgias, claudication,???bleeding abnormalities, neurologic motor abnormalities or difficulty with speech.???[name]???reports a history of non-insulin-requiring diabetes mellitus.??????He reports no history of myocardial infarction, congestive heart failure, severe renal insufficiency, transient ischemic attack or stroke.  ???  Historically, [name] was seen in the office on 09/12/10 for chest discomfort and a lightheaded sensation. He was found to be in atrial flutter with a rapid ventricular response rate and was hospitalized at [facility] where he underwent TEE-guided cardioversion and then stress testing. In late November 2012 he developed fatigue and anxiety and when seen on 02/27/11 was noted to be back in atrial flutter with 2:1 conduction and a ventricular response of 130 BPM. He was thought to have typical counter-clockwise flutter and cavo-tricuspid isthmus ablation was performed on 03/01/11 with excellent results. When I saw [name] in January 2013, I changed his beta-blocker from metoprolol to carvedilol for better antihypertensive control. Mr. Chistian Dan Mccarty was seen in the office on 12/25/12 for flushing and palpitations. A Holter monitor was obtained that showed an episode of non-sustained ventricular tachycardia. Then a stress test was obtained that was abnormal. On 01/28/13 he reported left arm and chest discomfort. Coronary angiography was performed on 01/29/13 which showed non-obstructive coronary artery disease that did not require intervention. On 03/05/13 [name] underwent left inguinal hernia repair without complication.???On 02/28/2017 he underwent left total hip arthroplasty and reports excellent results.??????He then underwent right total hip arthroplasty on May 28, 2017.  Surgery was uncomplicated except that [name] fell on Aug 18, 2017 and fractured his right prosthetic hip.  On 08/20/2017 he underwent open reduction internal fixation of right femur fracture and revision total hip arthroplasty, both components.       Vitals:    09/30/18 1327 09/30/18 1338   BP: 110/60 108/62   BP Source: Arm, Left Upper Arm, Right Upper   Pulse: 73    SpO2: 97%    Weight: 111.4 kg (245 lb 9.6 oz)    Height: 1.829 m (6')    PainSc: Zero      Body mass index is 33.31 kg/m???.     Past Medical History  Patient Active Problem List    Diagnosis Date Noted   ??? Acute kidney injury superimposed on chronic kidney disease (HCC) 08/21/2017   ??? Impaired mobility and activities of daily living 08/21/2017   ??? Acute pain due to trauma 08/19/2017   ??? 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 Closed right hip fracture (HCC) 08/18/2017   ??? Periprosthetic hip fracture 08/18/2017   ??? Hip dislocation, right, initial encounter (HCC) 08/18/2017   ??? 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 Hip  osteoarthritis 02/28/2017   ??? S/P ablation of atrial flutter 01/29/2013     2012 by Dr. Wallene Huh     ??? Atrial flutter (HCC) 02/22/2011   ??? Obesity 09/12/2010   ??? Depression 09/12/2010   ??? Anxiety 09/12/2010   ??? Diabetes (HCC) 09/12/2010 ??? Chest pain 09/20/2006     09/19/06 Heart Cath:   1. Normal left ventricular size, systolic function and hemodynamics.   2. Anterolateral hypokinesis.   3. Mild to moderate 3 vessel coronary disease with some associated coronary ectasia       ??? Hyperlipidemia 09/20/2006   ??? Hypertension 09/20/2006         Review of Systems   Constitution: Negative.   HENT: Negative.    Eyes: Negative.    Cardiovascular: Negative.    Respiratory: Negative.    Endocrine: Negative.    Hematologic/Lymphatic: Negative.    Skin: Negative.    Musculoskeletal: Negative.    Gastrointestinal: Negative.    Genitourinary: Negative.    Neurological: Negative.    Psychiatric/Behavioral: Negative.    Allergic/Immunologic: Negative.        Physical Exam  GENERAL: The patient is well developed, well nourished, resting comfortably and in no distress. ???  HEENT: No abnormalities of the visible oro-nasopharynx, conjunctiva or sclera are noted. ???  NECK: There is no jugular venous distension. Carotids are palpable and without bruits. There is no thyroid enlargement. ???  Chest: Lung fields are clear to auscultation. There are no wheezes or crackles. ???  CV: There is a regular rhythm. The first and second heart sounds are normal. There are no murmurs, gallops or rubs. His apical heart rate is???72???beats per minute. ???  ABD: The abdomen is soft and supple with normal bowel sounds. There is no hepatosplenomegaly, ascites, tenderness, masses or bruits. ???  Neuro: There are no focal motor defects. Ambulation is nearly normal. Cognitive function appears normal. ???  Ext:???There is no edema or evidence of deep vein thrombosis. Peripheral pulses are satisfactory. ???  SKIN:???There are no rashes and no cellulitis ???  PSYCH:???The patient is calm, rationale and oriented.    Cardiovascular Studies  A twelve-lead ECG was obtained on 12/10/2017 shows normal sinus rhythm with a heart rate of 71 bpm.  There is no evidence for myocardial ischemia or infarction.  ??? An echo Doppler study was performed on 02/08/2017 and revealed:  1. Endocardial definition is limited even with the use of contrast. ???However, no left ventricular regional wall motion abnormalities are seen. Overall LV systolic function appears normal. The estimated left ventricular ejection fraction appears to be in the range of 60%.  2. There is mild concentric left ventricular hypertrophy.  3. Right ventricular systolic function appears visually normal, although the TAPSE is reduced.  4. Mild right atrial enlargement.  5. Cardiac valve structures are not visualized well. ???However, there is no evidence of significant valvular regurgitation or stenosis by doppler exam.  6. A trivial pericardial effusion or epicardial fat pad is noted adjacent to the right ventricle.  ???  A???regadenoson thallium stress test was performed on 02/11/2017 revealed:  Scintigraphic (planar/tomographic):??????There is mild intensity, small sized reversible perfusion defect involving mid to basal inferior wall. ???There is normal homogenous uptake of thallium in all other myocardial segments. ???There are no other perfusion defects. ???All myocardial segments appear viable. Polar coordinate map identifies no perfusion abnormalities. Summed Stress Score: ???5, Summed Rest Score: ???0.???Regional Wall Thickening and Motion Post Stress: ??????There is normal left ventricular wall motion and thickening of  all myocardial segments qualitatively. Left Ventricular Ejection Fraction (post stress, in the resting state) =??????57 %. Left Ventricular End Diastolic Volume: 73 mL.???SUMMARY/OPINION:??????This study is probably normal without convincing evidence of significant myocardial ischemia. There is mild intensity, small sized reversible perfusion defect involving mid to basal inferior wall. It is likely secondary to ???diaphragmatic attenuation. ???Left ventricular systolic function is normal. There are no high risk prognostic indicators present. ???The ECG portion of the study is negative for ischemia. In aggregate the current study is low risk in regards to predicted annual cardiovascular mortality rate.???    Problems Addressed Today  Encounter Diagnoses   Name Primary?   ??? Essential hypertension    ??? Hyperlipidemia, unspecified hyperlipidemia type        Assessment and Plan     Mr. Herrera appears stable from a cardiovascular perspective. Regular mild aerobic exercise, weight loss and adherence to a heart healthy diet were recommended. The patient was given a requisition to check his Chem 7. I have asked him to return for follow-up in 6 months.         Current Medications (including today's revisions)  ??? acetaminophen (TYLENOL) 500 mg tablet Take 1,000 mg by mouth every 6 hours as needed for Pain. Max of 4,000 mg of acetaminophen in 24 hours.   ??? aspirin EC 81 mg tablet Take one tablet by mouth twice daily. Take with food. (Patient taking differently: Take 81 mg by mouth daily. Take with food.)   ??? atorvastatin (LIPITOR) 40 mg tablet Take one tablet by mouth twice daily.   ??? carvedilol (COREG) 3.125 mg tablet Take one tablet by mouth twice daily with meals.   ??? citalopram (CELEXA) 20 mg tablet Take 20 mg by mouth every morning.   ??? fish oil- omega 3-DHA/EPA 300/1,000 mg capsule Take 1 capsule by mouth twice daily.   ??? hydroCHLOROthiazide (HYDRODIURIL) 25 mg tablet TAKE 1 TABLET BY MOUTH  EVERY MORNING   ??? metFORMIN (GLUCOPHAGE) 1,000 mg tablet Take 1 Tab by mouth twice daily.   ??? Multivitamin Cmb No.21-Iron-FA (CENTRUM) 18-400 mg-mcg tab Take 1 tablet by mouth daily.   ??? nitroglycerin (NITROSTAT) 0.4 mg tablet 1 Tab as Needed for Chest Pain.   ??? other medication Equate No Drip - 1-2 sprays into both nostril daily as needed   ??? potassium chloride SR (KLOR-CON M20) 20 mEq tablet Take 20 mEq by mouth daily with breakfast.   ??? senna/docusate (SENOKOT-S) 8.6/50 mg tablet Take one tablet by mouth twice daily.   ??? traMADol (ULTRAM) 50 mg tablet Take one tablet by mouth every 6 hours as needed for Pain.   ??? valsartan (DIOVAN) 160 mg tablet Take one tablet by mouth daily.   ??? vit A/vit C/vit E/zinc/copper (OCUVITE PRESERVISION PO) Take  by mouth.

## 2018-09-30 NOTE — Patient Instructions
Follow up as directed.    Call sooner if issues.    Call the [facility] nursing line at [phone].    Leave a detailed message for the nurse in [facility] with how we can assist you and we will call you back.

## 2018-10-01 ENCOUNTER — Encounter: Admit: 2018-10-01 | Discharge: 2018-10-01

## 2018-10-01 DIAGNOSIS — E785 Hyperlipidemia, unspecified: Secondary | ICD-10-CM

## 2018-10-01 DIAGNOSIS — I10 Essential (primary) hypertension: Secondary | ICD-10-CM

## 2018-10-01 LAB — BASIC METABOLIC PANEL
Lab: 1.4 — ABNORMAL HIGH (ref 0.72–1.25)
Lab: 108 — ABNORMAL HIGH (ref 98–107)
Lab: 115 — ABNORMAL HIGH (ref 70–105)
Lab: 12
Lab: 23
Lab: 33 — ABNORMAL HIGH (ref 8.4–25.7)
Lab: 4.2
Lab: 50 — ABNORMAL LOW (ref >59)
Lab: 9.7
Lab: 139

## 2018-10-01 LAB — LIPID PROFILE
Lab: 118
Lab: 136
Lab: 27
Lab: 33 — ABNORMAL LOW (ref >40)
Lab: 4
Lab: 58

## 2018-10-01 LAB — ALT (SGPT): Lab: 34 /HPF (ref 0–3)

## 2018-10-02 ENCOUNTER — Encounter: Admit: 2018-10-02 | Discharge: 2018-10-02

## 2018-10-02 NOTE — Telephone Encounter
Labs sent to [facility] for review.  Will call pt with results after [facility] responds with recommendations.

## 2018-10-07 ENCOUNTER — Encounter: Admit: 2018-10-07 | Discharge: 2018-10-07

## 2018-10-07 NOTE — Telephone Encounter
-----   Message from Steven B Gollub, MD sent at 10/03/2018  5:08 PM CDT -----  Steve and Lisa: Mr. Heffelfinger's serum creatinine is elevated but has been elevated previously.  Please have him avoid any nonsteroidal anti-inflammatory drugs, hydrate and recheck his Chem-7 in 1 month.  Thanks.  SBG  ----- Message -----  From: Luikart, Lisa, RN  Sent: 10/02/2018   8:19 AM CDT  To: Steven B Gollub, MD    Labs for your review.

## 2018-10-07 NOTE — Telephone Encounter
Called to patient he states pcp called him as well and he is scheduled to recheck labs in one month

## 2018-10-07 NOTE — Telephone Encounter
-----   Message from Nehemiah Massed, MD sent at 10/03/2018  5:08 PM CDT -----  Sherlon Handing: Mr. Kassebaum's serum creatinine is elevated but has been elevated previously.  Please have him avoid any nonsteroidal anti-inflammatory drugs, hydrate and recheck his Chem-7 in 1 month.  Thanks.  SBG  ----- Message -----  From: Betsy Pries, RN  Sent: 10/02/2018   8:19 AM CDT  To: Nehemiah Massed, MD    Labs for your review.

## 2018-11-07 ENCOUNTER — Encounter: Admit: 2018-11-07 | Discharge: 2018-11-07

## 2018-11-07 DIAGNOSIS — E7849 Other hyperlipidemia: Secondary | ICD-10-CM

## 2018-11-07 DIAGNOSIS — I10 Essential (primary) hypertension: Secondary | ICD-10-CM

## 2018-11-07 DIAGNOSIS — E1369 Other specified diabetes mellitus with other specified complication: Secondary | ICD-10-CM

## 2018-11-07 NOTE — Telephone Encounter
-----   Message from Juanita Devincent sent at 10/07/2018  5:07 PM CDT -----  Regarding: look for labs  Pt sched to have labs reck look for results  ----- Message -----  From: Gollub, Steven B, MD  Sent: 10/03/2018   5:08 PM CDT  To: Lisa Luikart, RN, Matheu Ploeger    Steve and Lisa: [name]'s serum creatinine is elevated but has been elevated previously.  Please have him avoid any nonsteroidal anti-inflammatory drugs, hydrate and recheck his Chem-7 in 1 month.  Thanks.  SBG  ----- Message -----  From: Luikart, Lisa, RN  Sent: 10/02/2018   8:19 AM CDT  To: Steven B Gollub, MD    Labs for your review.

## 2018-11-07 NOTE — Telephone Encounter
-----   Message from Asencion Noble sent at 10/07/2018  5:07 PM CDT -----  Regarding: look for labs  Pt sched to have labs reck look for results  ----- Message -----  From: Nehemiah Massed, MD  Sent: 10/03/2018   5:08 PM CDT  To: Betsy Pries, RN, Rutherford Guys and Lattie Haw: Mr. Diemer's serum creatinine is elevated but has been elevated previously.  Please have him avoid any nonsteroidal anti-inflammatory drugs, hydrate and recheck his Chem-7 in 1 month.  Thanks.  SBG  ----- Message -----  From: Betsy Pries, RN  Sent: 10/02/2018   8:19 AM CDT  To: Nehemiah Massed, MD    Labs for your review.

## 2018-11-11 IMAGING — CR CHEST
1 series · 1 of 1 positions shown · non-contrast
Comparison: none

[chest port x-wise]
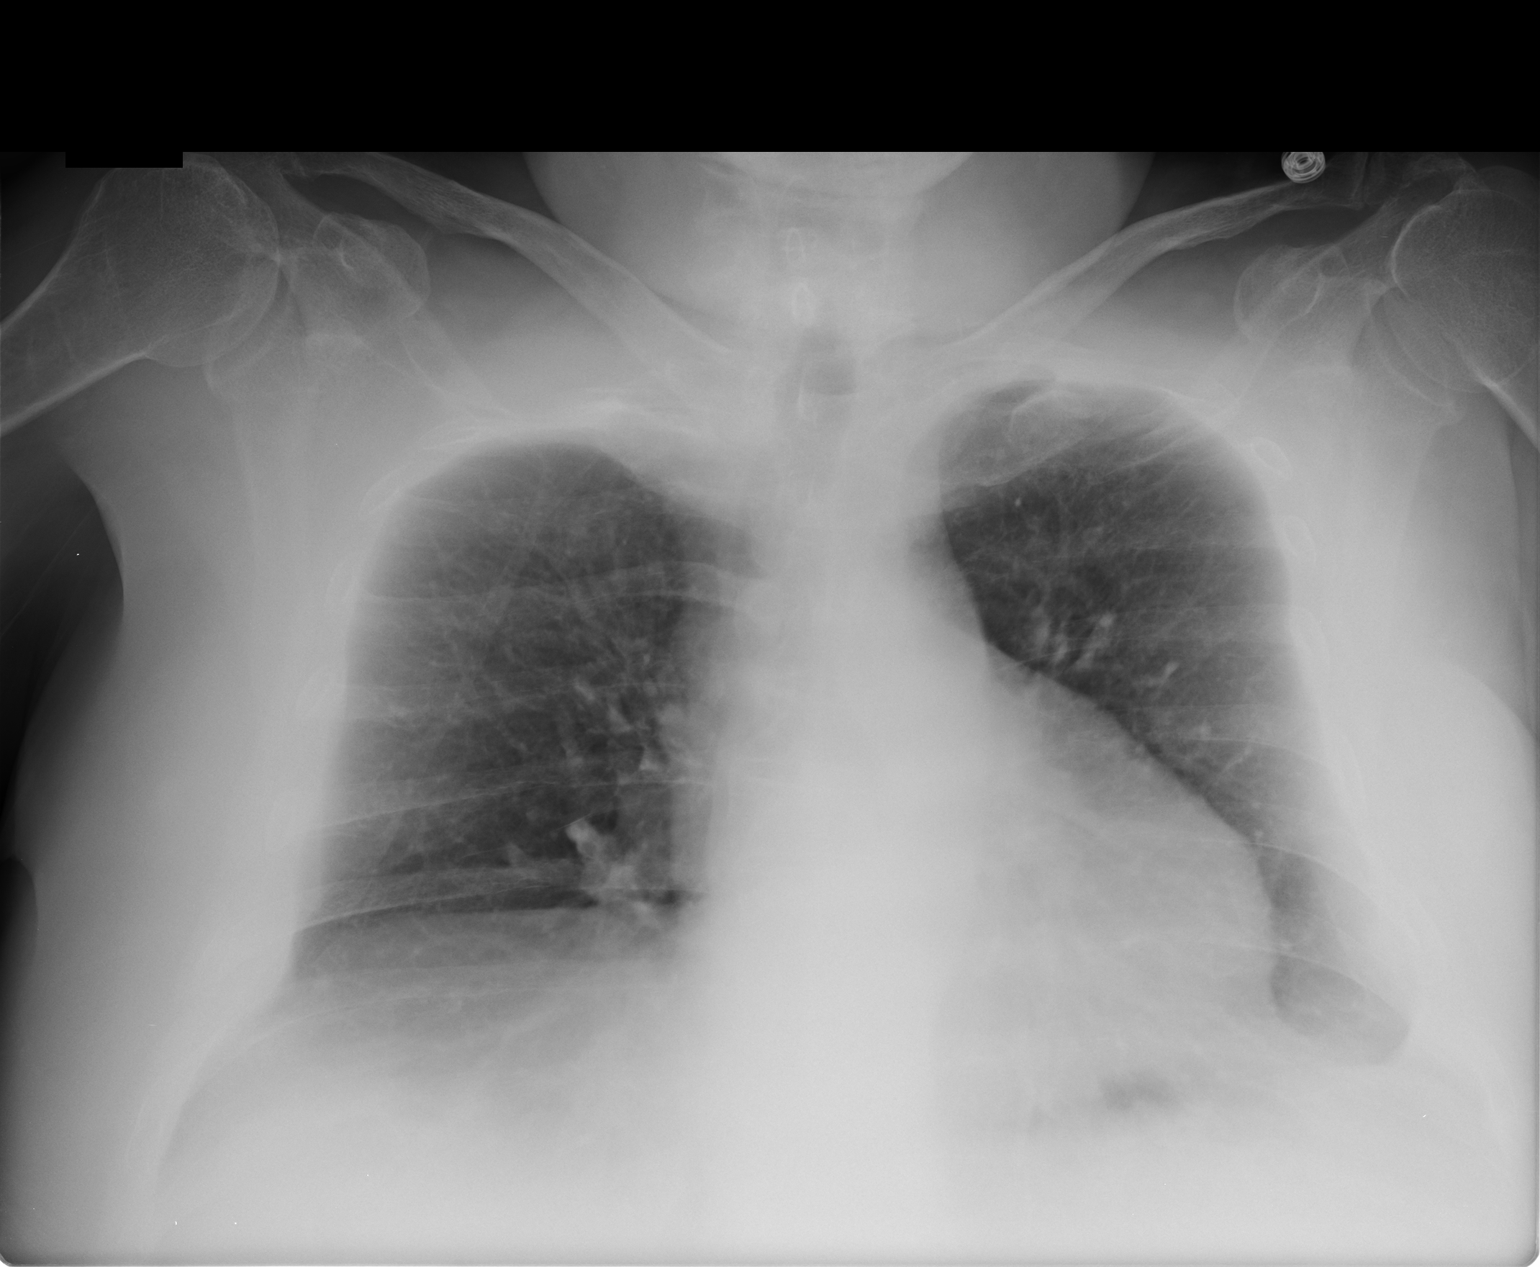

[1 of 1 positions shown; findings below may reference images not displayed]

EXAM
Portable chest x-ray.

INDICATION
POSSIBLE HIP FX
FALL. HIP FX. HB

FINDINGS
A single portable view of the chest was obtained.
The heart size and pulmonary vascularity are within normal limits.
There is mild pleural thickening inferiorly on the left.
The lungs are clear. There is mild pulmonary hyperinflation.

IMPRESSION
There is mild pulmonary hyperinflation. There is a small amount of pleural thickening on the left.
There is no acute appearing abnormality of the chest.

Tech Notes:

FALL. HIP FX. HB

## 2018-11-26 ENCOUNTER — Encounter: Admit: 2018-11-26 | Discharge: 2018-11-26

## 2018-11-26 MED ORDER — CARVEDILOL 3.125 MG PO TAB
ORAL_TABLET | Freq: Two times a day (BID) | ORAL | 3 refills | 90.00000 days | Status: DC
Start: 2018-11-26 — End: 2019-11-05

## 2018-12-05 ENCOUNTER — Encounter: Admit: 2018-12-05 | Discharge: 2018-12-05

## 2018-12-05 MED ORDER — VALSARTAN 160 MG PO TAB
ORAL_TABLET | Freq: Every day | ORAL | 3 refills | 60.00000 days | Status: DC
Start: 2018-12-05 — End: 2019-11-05

## 2018-12-09 ENCOUNTER — Encounter: Admit: 2018-12-09 | Discharge: 2018-12-09 | Payer: MEDICARE

## 2018-12-09 LAB — BASIC METABOLIC PANEL
Lab: 1.2 — ABNORMAL HIGH (ref 0.72–1.25)
Lab: 104
Lab: 119 — ABNORMAL HIGH (ref 70–105)
Lab: 139
Lab: 4.6
Lab: 9.8

## 2019-05-14 ENCOUNTER — Encounter: Admit: 2019-05-14 | Discharge: 2019-05-14 | Payer: MEDICARE

## 2019-05-14 DIAGNOSIS — E119 Type 2 diabetes mellitus without complications: Secondary | ICD-10-CM

## 2019-05-14 DIAGNOSIS — M1990 Unspecified osteoarthritis, unspecified site: Secondary | ICD-10-CM

## 2019-05-14 DIAGNOSIS — Z8669 Personal history of other diseases of the nervous system and sense organs: Secondary | ICD-10-CM

## 2019-05-14 DIAGNOSIS — F419 Anxiety disorder, unspecified: Secondary | ICD-10-CM

## 2019-05-14 DIAGNOSIS — Z9889 Other specified postprocedural states: Secondary | ICD-10-CM

## 2019-05-14 DIAGNOSIS — N189 Chronic kidney disease, unspecified: Secondary | ICD-10-CM

## 2019-05-14 DIAGNOSIS — I4891 Unspecified atrial fibrillation: Secondary | ICD-10-CM

## 2019-05-14 DIAGNOSIS — I4892 Unspecified atrial flutter: Secondary | ICD-10-CM

## 2019-05-14 DIAGNOSIS — E785 Hyperlipidemia, unspecified: Secondary | ICD-10-CM

## 2019-05-14 DIAGNOSIS — R079 Chest pain, unspecified: Secondary | ICD-10-CM

## 2019-05-14 DIAGNOSIS — F329 Major depressive disorder, single episode, unspecified: Secondary | ICD-10-CM

## 2019-05-14 DIAGNOSIS — E669 Obesity, unspecified: Secondary | ICD-10-CM

## 2019-05-14 DIAGNOSIS — I10 Essential (primary) hypertension: Secondary | ICD-10-CM

## 2019-09-01 ENCOUNTER — Encounter: Admit: 2019-09-01 | Discharge: 2019-09-01 | Payer: MEDICARE

## 2019-09-01 DIAGNOSIS — Z96641 Presence of right artificial hip joint: Secondary | ICD-10-CM

## 2019-09-14 ENCOUNTER — Ambulatory Visit: Admit: 2019-09-14 | Discharge: 2019-09-14 | Payer: MEDICARE

## 2019-09-14 ENCOUNTER — Encounter: Admit: 2019-09-14 | Discharge: 2019-09-14 | Payer: MEDICARE

## 2019-09-14 DIAGNOSIS — E785 Hyperlipidemia, unspecified: Secondary | ICD-10-CM

## 2019-09-14 DIAGNOSIS — M1990 Unspecified osteoarthritis, unspecified site: Secondary | ICD-10-CM

## 2019-09-14 DIAGNOSIS — E119 Type 2 diabetes mellitus without complications: Secondary | ICD-10-CM

## 2019-09-14 DIAGNOSIS — I4891 Unspecified atrial fibrillation: Secondary | ICD-10-CM

## 2019-09-14 DIAGNOSIS — Z96641 Presence of right artificial hip joint: Secondary | ICD-10-CM

## 2019-09-14 DIAGNOSIS — R079 Chest pain, unspecified: Secondary | ICD-10-CM

## 2019-09-14 DIAGNOSIS — Z9889 Other specified postprocedural states: Secondary | ICD-10-CM

## 2019-09-14 DIAGNOSIS — F419 Anxiety disorder, unspecified: Secondary | ICD-10-CM

## 2019-09-14 DIAGNOSIS — I4892 Unspecified atrial flutter: Secondary | ICD-10-CM

## 2019-09-14 DIAGNOSIS — I10 Essential (primary) hypertension: Secondary | ICD-10-CM

## 2019-09-14 DIAGNOSIS — E669 Obesity, unspecified: Secondary | ICD-10-CM

## 2019-09-14 DIAGNOSIS — F329 Major depressive disorder, single episode, unspecified: Secondary | ICD-10-CM

## 2019-09-14 DIAGNOSIS — N189 Chronic kidney disease, unspecified: Secondary | ICD-10-CM

## 2019-09-14 DIAGNOSIS — Z8669 Personal history of other diseases of the nervous system and sense organs: Secondary | ICD-10-CM

## 2019-09-14 NOTE — Progress Notes
Orthopaedic Post-Operative Visit - [name], MD    Date of Visit: 09/14/19  _______________________________________________  DIAGNOSIS:   s/p ORIF of right femur fracture, Revision THA (08/20/2017)  ?  PROCEDURE:   Left THA (02/28/17)  Right THA (05/28/2017)  ORIF of right femur fracture, Revision THA (08/20/2017)  ?  PLAN:   Overall doing well. Continues to report no pain to the bilateral hips.  He ambulates freely without assistive device. His films, compared to last year, demonstrate interval callus formation.  He will follow up on a PRN basis but is free to call our clinic if he needs anything.  ________________________________________________    Interval Hx:  Patient returns for routine postoperative visit. Doing well. He is having no pain. He is able to do all of his daily activities without any discomfort. Overall pleased with his outcome given the circumstances.       PHYSICAL EXAM:  Vitals:    09/14/19 1406   BP: 117/67   Pulse: 83   Resp: 18   Temp: 36.3 ?C (97.3 ?F)   SpO2: 98%     No tenderness at left greater trochanter  No tenderness to right surgical site. Incisions well healed.  Motor intact with knee flexion/extension and ankle dorsiflexion/plantarflexion bilaterally.  Distal pulses intact, skin is well perfused    Constitutional: In no distress  Eyes: No scleral icterus, conjugate gaze intact  Respiratory: Non labored respirations, no audible wheezing, no coughing  Cardiovascular: Normal rate per peripheral pulse, normal rhythm per peripheral pulse  Lymphatic: No peripheral edema. No asymmetric edema.  Skin: No rashes. No ulcerations  Neurological: Normal sensation to light touch.  No detectable weakness  Psychiatric: Mood is appropriate.  Judgement and insight seems appropriate.    IMAGING: Radiographs of the right hip and femur taken today which demonstrates interval healing at the fracture site.  There has been some displacement of the proximal fragment compared to the postoperative radiographs, but this is stable from radiographs in 11/11/2017.    REVIEW OF SYSTEMS:   12 systems (General, HENT, Eyes, Respiratory, CV, GI, GU, MS, Skin, Neurological, Hematologic, and Psychiatric) were reviewed and documented per the patient intake form completed during today's visit. The review was negative for reported symptoms except those detailed in HPI     PAST MEDICAL HISTORY:   Medical History:   Diagnosis Date   ? Anxiety 09/12/2010   ? Arthritis     lower back   ? Atrial fibrillation (HCC) 09/26/2010    s/p atrial ablation surgery 2012   ? Atrial flutter (HCC) 02/22/2011    s/p atrial ablation surgery 2012   ? Chest pain    ? Chest pain 09/20/2006    09/19/06 Heart Cath:  1. Normal left ventricular size, systolic function and hemodynamics.  2. Anterolateral hypokinesis.  3. Mild to moderate 3 vessel coronary disease with some associated coronary ectasia     ? Chronic kidney disease     stage 3   ? Depression 09/12/2010   ? Diabetes (HCC) 09/12/2010   ? Diabetes mellitus type II 09/12/2010   ? HTN (hypertension)    ? Hx of sleep apnea     does not use CPAP after 70 lb weight loss and sinus issues   ? Hyperlipidemia 09/20/2006   ? Hypertension 09/20/2006   ? Obesity 09/12/2010   ? S/P ablation of atrial flutter 01/29/2013    2012 by Dr. Wallene Huh       PAST SURGICAL HISTORY:   Surgical  History:   Procedure Laterality Date   ? ATRIAL ABLATION SURGERY  2012   ? LEFT TOTAL HIP ARTHROPLASTY Left 02/28/2017    Performed by Sula Rumple, MD at [facility] OR   ? RIGHT TOTAL HIP ARTHROPLASTY Right 05/28/2017    Performed by Sula Rumple, MD at [facility] OR   ? REVISION ARTHROPLASTY TOTAL HIP Right 08/20/2017    Performed by Sula Rumple, MD at [facility] OR   ? CARDIAC CATHERIZATION     ? CHOLECYSTECTOMY     ? HX TONSILLECTOMY     ? KNEE ARTHROSCOPY Left        FAMILY HISTORY:   Family History   Problem Relation Age of Onset   ? Hypertension Mother    ? Diabetes Mother    ? Stroke Mother        SOCIAL HISTORY:  reports that he quit smoking about 49 years ago. He quit after 4.00 years of use. He has never used smokeless tobacco. He reports that he does not drink alcohol and does not use drugs.    MEDICATIONS:   Current Outpatient Medications:   ?  acetaminophen (TYLENOL) 500 mg tablet, Take 1,000 mg by mouth every 6 hours as needed for Pain. Max of 4,000 mg of acetaminophen in 24 hours., Disp: , Rfl:   ?  aspirin EC 81 mg tablet, Take one tablet by mouth twice daily. Take with food. (Patient taking differently: Take 81 mg by mouth daily. Take with food.), Disp: 60 tablet, Rfl: 0  ?  atorvastatin (LIPITOR) 40 mg tablet, Take one tablet by mouth twice daily., Disp: 180 tablet, Rfl: 3  ?  carvediloL (COREG) 3.125 mg tablet, TAKE 1 TABLET BY MOUTH  TWICE A DAY WITH MEALS, Disp: 180 tablet, Rfl: 3  ?  citalopram (CELEXA) 20 mg tablet, Take 20 mg by mouth every morning., Disp: , Rfl:   ?  fish oil- omega 3-DHA/EPA 300/1,000 mg capsule, Take 1 capsule by mouth twice daily., Disp: , Rfl:   ?  metFORMIN (GLUCOPHAGE) 1,000 mg tablet, Take 1 Tab by mouth twice daily., Disp: , Rfl:   ?  Multivitamin Cmb No.21-Iron-FA (CENTRUM) 18-400 mg-mcg tab, Take 1 tablet by mouth daily., Disp: , Rfl:   ?  nitroglycerin (NITROSTAT) 0.4 mg tablet, 1 Tab as Needed for Chest Pain., Disp: 25 Tab, Rfl: 1  ?  other medication, Equate No Drip - 1-2 sprays into both nostril daily as needed, Disp: , Rfl:   ?  potassium chloride SR (KLOR-CON M20) 20 mEq tablet, Take 20 mEq by mouth daily with breakfast., Disp: , Rfl:   ?  prednisolone acetate (PRED FORTE) 1 % ophthalmic suspension, INSTILL 1 DROP INTO LEFT EYE THREE TIMES DAILY BEGIN 1 DAY AFTER CATARACT SURGERY, Disp: , Rfl:   ?  senna/docusate (SENOKOT-S) 8.6/50 mg tablet, Take one tablet by mouth twice daily., Disp: 30 tablet, Rfl: 3  ?  traMADol (ULTRAM) 50 mg tablet, Take one tablet by mouth every 6 hours as needed for Pain., Disp: 60 tablet, Rfl: 0  ?  valsartan (DIOVAN) 160 mg tablet, TAKE 1 TABLET BY MOUTH  DAILY, Disp: 90 tablet, Rfl: 3  ? vit A/vit C/vit E/zinc/copper (OCUVITE PRESERVISION PO), Take  by mouth., Disp: , Rfl:     ALLERGIES:   Allergies   Allergen Reactions   ? Adhesive Tape (Rosins) RASH   ? Ace Inhibitors UNKNOWN   ? Nsaids (Non-Steroidal Anti-Inflammatory Drug) SEE COMMENTS     Stage  3 kidney disease

## 2019-11-04 ENCOUNTER — Encounter: Admit: 2019-11-04 | Discharge: 2019-11-04 | Payer: MEDICARE

## 2019-11-05 MED ORDER — CARVEDILOL 3.125 MG PO TAB
ORAL_TABLET | Freq: Two times a day (BID) | ORAL | 3 refills | 90.00000 days | Status: AC
Start: 2019-11-05 — End: ?

## 2019-11-05 MED ORDER — VALSARTAN 160 MG PO TAB
ORAL_TABLET | Freq: Every day | ORAL | 3 refills | 60.00000 days | Status: AC
Start: 2019-11-05 — End: ?

## 2020-01-12 ENCOUNTER — Encounter: Admit: 2020-01-12 | Discharge: 2020-01-12 | Payer: MEDICARE

## 2020-03-15 ENCOUNTER — Encounter: Admit: 2020-03-15 | Discharge: 2020-03-15 | Payer: MEDICARE

## 2020-03-15 DIAGNOSIS — M1990 Unspecified osteoarthritis, unspecified site: Secondary | ICD-10-CM

## 2020-03-15 DIAGNOSIS — E119 Type 2 diabetes mellitus without complications: Secondary | ICD-10-CM

## 2020-03-15 DIAGNOSIS — I4891 Unspecified atrial fibrillation: Secondary | ICD-10-CM

## 2020-03-15 DIAGNOSIS — Z8669 Personal history of other diseases of the nervous system and sense organs: Secondary | ICD-10-CM

## 2020-03-15 DIAGNOSIS — E669 Obesity, unspecified: Secondary | ICD-10-CM

## 2020-03-15 DIAGNOSIS — I483 Typical atrial flutter: Secondary | ICD-10-CM

## 2020-03-15 DIAGNOSIS — F419 Anxiety disorder, unspecified: Secondary | ICD-10-CM

## 2020-03-15 DIAGNOSIS — N189 Chronic kidney disease, unspecified: Secondary | ICD-10-CM

## 2020-03-15 DIAGNOSIS — R079 Chest pain, unspecified: Secondary | ICD-10-CM

## 2020-03-15 DIAGNOSIS — F32A Depression: Secondary | ICD-10-CM

## 2020-03-15 DIAGNOSIS — I10 Essential (primary) hypertension: Secondary | ICD-10-CM

## 2020-03-15 DIAGNOSIS — E785 Hyperlipidemia, unspecified: Secondary | ICD-10-CM

## 2020-03-15 DIAGNOSIS — I4892 Unspecified atrial flutter: Secondary | ICD-10-CM

## 2020-03-15 DIAGNOSIS — Z9889 Other specified postprocedural states: Secondary | ICD-10-CM

## 2020-03-15 NOTE — Patient Instructions
Follow up as directed.  Call sooner if issues.  Call the [facility] nursing line at [phone].  Leave a detailed message for the nurse in [facility] with how we can assist you and we will call you back.

## 2020-03-15 NOTE — Progress Notes
Date of Service: 03/15/2020    Dan Mccarty is a 74 y.o. male.       HPI    [name] is followed for hypertension, palpitations, paroxysmal atrial flutter, non-obstructive coronary artery disease and diabetes mellitus.  He reports no febrile or infectious symptoms during the Covid pandemic and he and he has received his Covid vaccines plus booster.Dan Mccarty ?The patient reports that his blood sugars are now under good control and that his last hemoglobin A1c was 6.1%. His blood pressure is well controlled when he checks it at home and generally runs in the range of 120/70 mmHg. His wife of 51 years passed away in 13-Mar-2021from complications related to chronic renal failure. He has had appropriate grieving response. Otherwise, over the past?6?months, the patient has been doing well and he reports no angina, congestive symptoms, palpitations, sensation of sustained forceful heart pounding, lightheadedness or syncope. His exercise tolerance has?been stable and he walks for 10 or 15 minutes every other day. The patient reports no myalgias, claudication,?bleeding abnormalities, neurologic motor abnormalities or difficulty with speech.?[name]?reports a history of non-insulin-requiring diabetes mellitus.??He reports no history of myocardial infarction, congestive heart failure, severe renal insufficiency, transient ischemic attack or stroke.   ?  Historically, [name] was seen in the office on 09/12/10 for chest discomfort and a lightheaded sensation. He was found to be in atrial flutter with a rapid ventricular response rate and was hospitalized at [facility] where he underwent TEE-guided cardioversion and then stress testing. In late November 2012 he developed fatigue and anxiety and when seen on 02/27/11 was noted to be back in atrial flutter with 2:1 conduction and a ventricular response of 130 BPM. He was thought to have typical counter-clockwise flutter and cavo-tricuspid isthmus ablation was performed on 03/01/11 with excellent results. When I saw [name] in January 2013, I changed his beta-blocker from metoprolol to carvedilol for better antihypertensive control. Mr. Ary Dan Mccarty was seen in the office on 12/25/12 for flushing and palpitations. A Holter monitor was obtained that showed an episode of non-sustained ventricular tachycardia. Then a stress test was obtained that was abnormal. On 01/28/13 he reported left arm and chest discomfort. Coronary angiography was performed on 01/29/13 which showed non-obstructive coronary artery disease that did not require intervention. On 03/05/13 [name] underwent left inguinal hernia repair without complication.?On 02/28/2017 he underwent left total hip arthroplasty and reports excellent results.??He?then underwent?right total hip arthroplasty on May 28, 2017.??Surgery was uncomplicated except that [name] fell on Aug 18, 2017 and fractured his right prosthetic hip. ?On 08/20/2017 he underwent open reduction internal fixation of right femur fracture?and revision total hip arthroplasty, both components.           Vitals:    03/15/20 1448   BP: (!) 150/88   BP Source: Arm, Left Upper   Patient Position: Sitting   Pulse: 76   SpO2: 97%   Weight: 111.2 kg (245 lb 3.2 oz)   Height: 1.829 m (6')   PainSc: Zero     Body mass index is 33.26 kg/m?Dan Mccarty     Past Medical History  Patient Active Problem List    Diagnosis Date Noted   ? 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 Impaired mobility and activities of daily living 08/21/2017   ? Acute pain due to trauma 08/19/2017   ? 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 Closed right hip fracture (HCC) 08/18/2017   ?  Periprosthetic hip fracture 08/18/2017   ? Hip dislocation, right, initial encounter (HCC) 08/18/2017   ? 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 S/P ablation of atrial flutter 01/29/2013     2012 by Dr. Wallene Huh     ? Atrial flutter (HCC) 02/22/2011   ? Obesity 09/12/2010   ? Depression 09/12/2010   ? Anxiety 09/12/2010   ? Diabetes (HCC) 09/12/2010   ? Chest pain 09/20/2006     09/19/06 Heart Cath:   1. Normal left ventricular size, systolic function and hemodynamics.   2. Anterolateral hypokinesis.   3. Mild to moderate 3 vessel coronary disease with some associated coronary ectasia       ? Hyperlipidemia 09/20/2006   ? Hypertension 09/20/2006         Review of Systems   Constitutional: Negative.   HENT: Negative.    Eyes: Negative.    Cardiovascular: Negative.    Respiratory: Negative.    Endocrine: Negative.    Hematologic/Lymphatic: Negative.    Skin: Negative.    Musculoskeletal: Negative.    Gastrointestinal: Negative.    Genitourinary: Negative.    Neurological: Negative.    Psychiatric/Behavioral: Negative.    Allergic/Immunologic: Negative.        Physical Exam  GENERAL: The patient is well developed, well nourished, resting comfortably and in no distress. ?  HEENT: No abnormalities of the visible oro-nasopharynx, conjunctiva or sclera are noted. ?  NECK: There is no jugular venous distension. Carotids are palpable and without bruits. There is no thyroid enlargement. ?  Chest: Lung fields are clear to auscultation. There are no wheezes or crackles. ?  CV: There is a regular rhythm. The first and second heart sounds are normal. There are no murmurs, gallops or rubs. His apical heart rate is?76?beats per minute. ?  ABD: The abdomen is soft and supple with normal bowel sounds. There is no hepatosplenomegaly, ascites, tenderness, masses or bruits. ?  Neuro: There are no focal motor defects. Ambulation is?nearly?normal. Cognitive function appears normal. ?  Ext:?There is no edema or evidence of deep vein thrombosis. Peripheral pulses are satisfactory. ?  SKIN:?There are no rashes and no cellulitis ?  PSYCH:?The patient is calm, rationale and oriented.    Cardiovascular Studies   twelve-lead ECG obtained on 05/14/2019 reveals normal sinus rhythm with a heart rate of 72 bpm.  There is no evidence of myocardial ischemia or infarction.  Abs from 10/01/2018 revealed total cholesterol 118, triglycerides 136, HDL 33 and LDL cholesterol 58 mg/dL.  His ALT = 34.  His hemoglobin A1c was 6.2%.  Labs from 12/09/2018 revealed serum potassium 4.6 mmol/L and serum creatinine 1.27 mg/dL.  ?  An echo Doppler study was performed on 02/08/2017 and revealed:  1. Endocardial definition is limited even with the use of contrast. ?However, no left ventricular regional wall motion abnormalities are seen. Overall LV systolic function appears normal. The estimated left ventricular ejection fraction appears to be in the range of 60%.  2. There is mild concentric left ventricular hypertrophy.  3. Right ventricular systolic function appears visually normal, although the TAPSE is reduced.  4. Mild right atrial enlargement.  5. Cardiac valve structures are not visualized well. ?However, there is no evidence of significant valvular regurgitation or stenosis by doppler exam.  6. A trivial pericardial effusion or epicardial fat pad is noted adjacent to the right ventricle.  ?  A?regadenoson thallium stress test was performed on 02/11/2017 revealed:  Scintigraphic (planar/tomographic):??There is mild intensity, small sized reversible perfusion defect involving mid to basal inferior wall. ?There is normal homogenous uptake of thallium in all other myocardial segments. ?There are no other perfusion defects. ?All myocardial segments appear viable. Polar coordinate map identifies no perfusion abnormalities. Summed Stress Score: ?5, Summed Rest Score: ?0.?Regional Wall Thickening and Motion Post Stress: ??There is normal left ventricular wall motion and thickening of all myocardial segments qualitatively. Left Ventricular Ejection Fraction (post stress, in the resting state) =??57 %. Left Ventricular End Diastolic Volume: 73 mL.?SUMMARY/OPINION:??This study is probably normal without convincing evidence of significant myocardial ischemia. There is mild intensity, small sized reversible perfusion defect involving mid to basal inferior wall. It is likely secondary to ?diaphragmatic attenuation. ?Left ventricular systolic function is normal. There are no high risk prognostic indicators present. ?The ECG portion of the study is negative for ischemia. In aggregate the current study is low risk in regards to predicted annual cardiovascular mortality rate.?    Cardiovascular Health Factors  Vitals BP Readings from Last 3 Encounters:   03/15/20 (!) 150/88   09/14/19 117/67   05/14/19 106/64     Wt Readings from Last 3 Encounters:   03/15/20 111.2 kg (245 lb 3.2 oz)   09/14/19 111.2 kg (245 lb 3.2 oz)   05/14/19 107 kg (236 lb)     BMI Readings from Last 3 Encounters:   03/15/20 33.26 kg/m?   09/14/19 33.26 kg/m?   05/14/19 32.01 kg/m?      Smoking Social History     Tobacco Use   Smoking Status Former Smoker   ? Years: 4.00   ? Quit date: 02/28/1970   ? Years since quitting: 50.0   Smokeless Tobacco Never Used      Lipid Profile Cholesterol   Date Value Ref Range Status   10/01/2018 118  Final     HDL   Date Value Ref Range Status   10/01/2018 33 (L) >40 Final     LDL   Date Value Ref Range Status   10/01/2018 58  Final     Triglycerides   Date Value Ref Range Status   10/01/2018 136  Final      Blood Sugar Hemoglobin A1C   Date Value Ref Range Status   01/04/2020 6.1  Final     Glucose   Date Value Ref Range Status   01/05/2020 119 (H) 70 - 105 Final   12/09/2018 119 (H) 70 - 105 Final   10/01/2018 115 (H) 70 - 105 Final   10/01/2018 115 (H) 70 - 105 Final   09/12/2010 DUPLICATE ORDER 70 - 100 MG/DL Final     Glucose, POC   Date Value Ref Range Status   08/23/2017 153 (H) 70 - 100 MG/DL Final   91/47/8295 621 (H) 70 - 100 MG/DL Final   30/86/5784 696 (H) 70 - 100 MG/DL Final          Problems Addressed Today  Hypertension. Hyperlipidemia. Diabetes mellitus.  Assessment and Plan     Mr.?Kazlauskas appears stable from a cardiovascular perspective. He reports no angina or congestive symptoms. He indicates that his blood pressure is well controlled. I have asked the patient to keep a log book of his BP readings and to report BP readings exceeding 130/80 mm Hg.   Regular?mild?aerobic exercise, weight loss and adherence to a heart healthy diet were recommended.  I have asked him?to return for follow-up in 12?months.  Current Medications (including today's revisions)  ? acetaminophen (TYLENOL) 500 mg tablet Take 1,000 mg by mouth every 6 hours as needed for Pain. Max of 4,000 mg of acetaminophen in 24 hours.   ? aspirin EC 81 mg tablet Take one tablet by mouth twice daily. Take with food. (Patient taking differently: Take 81 mg by mouth daily. Take with food.)   ? atorvastatin (LIPITOR) 40 mg tablet Take one tablet by mouth twice daily.   ? carvediloL (COREG) 3.125 mg tablet TAKE 1 TABLET BY MOUTH  TWICE DAILY WITH MEALS   ? citalopram (CELEXA) 20 mg tablet Take 20 mg by mouth every morning.   ? fish oil- omega 3-DHA/EPA 300/1,000 mg capsule Take 1 capsule by mouth twice daily.   ? metFORMIN (GLUCOPHAGE) 1,000 mg tablet Take 1 Tab by mouth twice daily.   ? Multivitamin Cmb No.21-Iron-FA (CENTRUM) 18-400 mg-mcg tab Take 1 tablet by mouth daily.   ? nitroglycerin (NITROSTAT) 0.4 mg tablet 1 Tab as Needed for Chest Pain.   ? other medication Equate No Drip - 1-2 sprays into both nostril daily as needed   ? potassium chloride SR (KLOR-CON M20) 20 mEq tablet Take 20 mEq by mouth daily with breakfast.   ? prednisolone acetate (PRED FORTE) 1 % ophthalmic suspension INSTILL 1 DROP INTO LEFT EYE THREE TIMES DAILY BEGIN 1 DAY AFTER CATARACT SURGERY   ? senna/docusate (SENOKOT-S) 8.6/50 mg tablet Take one tablet by mouth twice daily.   ? traMADol (ULTRAM) 50 mg tablet Take one tablet by mouth every 6 hours as needed for Pain.   ? valsartan (DIOVAN) 160 mg tablet TAKE 1 TABLET BY MOUTH  DAILY   ? vit A/vit C/vit E/zinc/copper (OCUVITE PRESERVISION PO) Take  by mouth.

## 2020-04-20 ENCOUNTER — Encounter: Admit: 2020-04-20 | Discharge: 2020-04-20 | Payer: MEDICARE

## 2020-04-20 NOTE — Telephone Encounter
Patient left VM reporting that his dentist orders antibiotics prior to dental appointments. Patient would like to know if this is correct procedure.  I left a detailed VM for the patient explaining that it is Dr. Gabriela Eves recommendation to have prophylactic antibiotics prior to any dental appt including routine cleanings.  Asked pt to call back with any additional questions.

## 2020-09-24 ENCOUNTER — Encounter: Admit: 2020-09-24 | Discharge: 2020-09-24 | Payer: MEDICARE

## 2020-09-24 MED ORDER — CARVEDILOL 3.125 MG PO TAB
ORAL_TABLET | Freq: Two times a day (BID) | 3 refills
Start: 2020-09-24 — End: ?

## 2020-09-24 MED ORDER — VALSARTAN 160 MG PO TAB
ORAL_TABLET | Freq: Every day | 3 refills
Start: 2020-09-24 — End: ?

## 2020-12-28 ENCOUNTER — Encounter: Admit: 2020-12-28 | Discharge: 2020-12-28 | Payer: MEDICARE

## 2021-02-23 IMAGING — CR CHEST
2 series · 2 of 2 positions shown · non-contrast
Comparison: none

[chest pa]
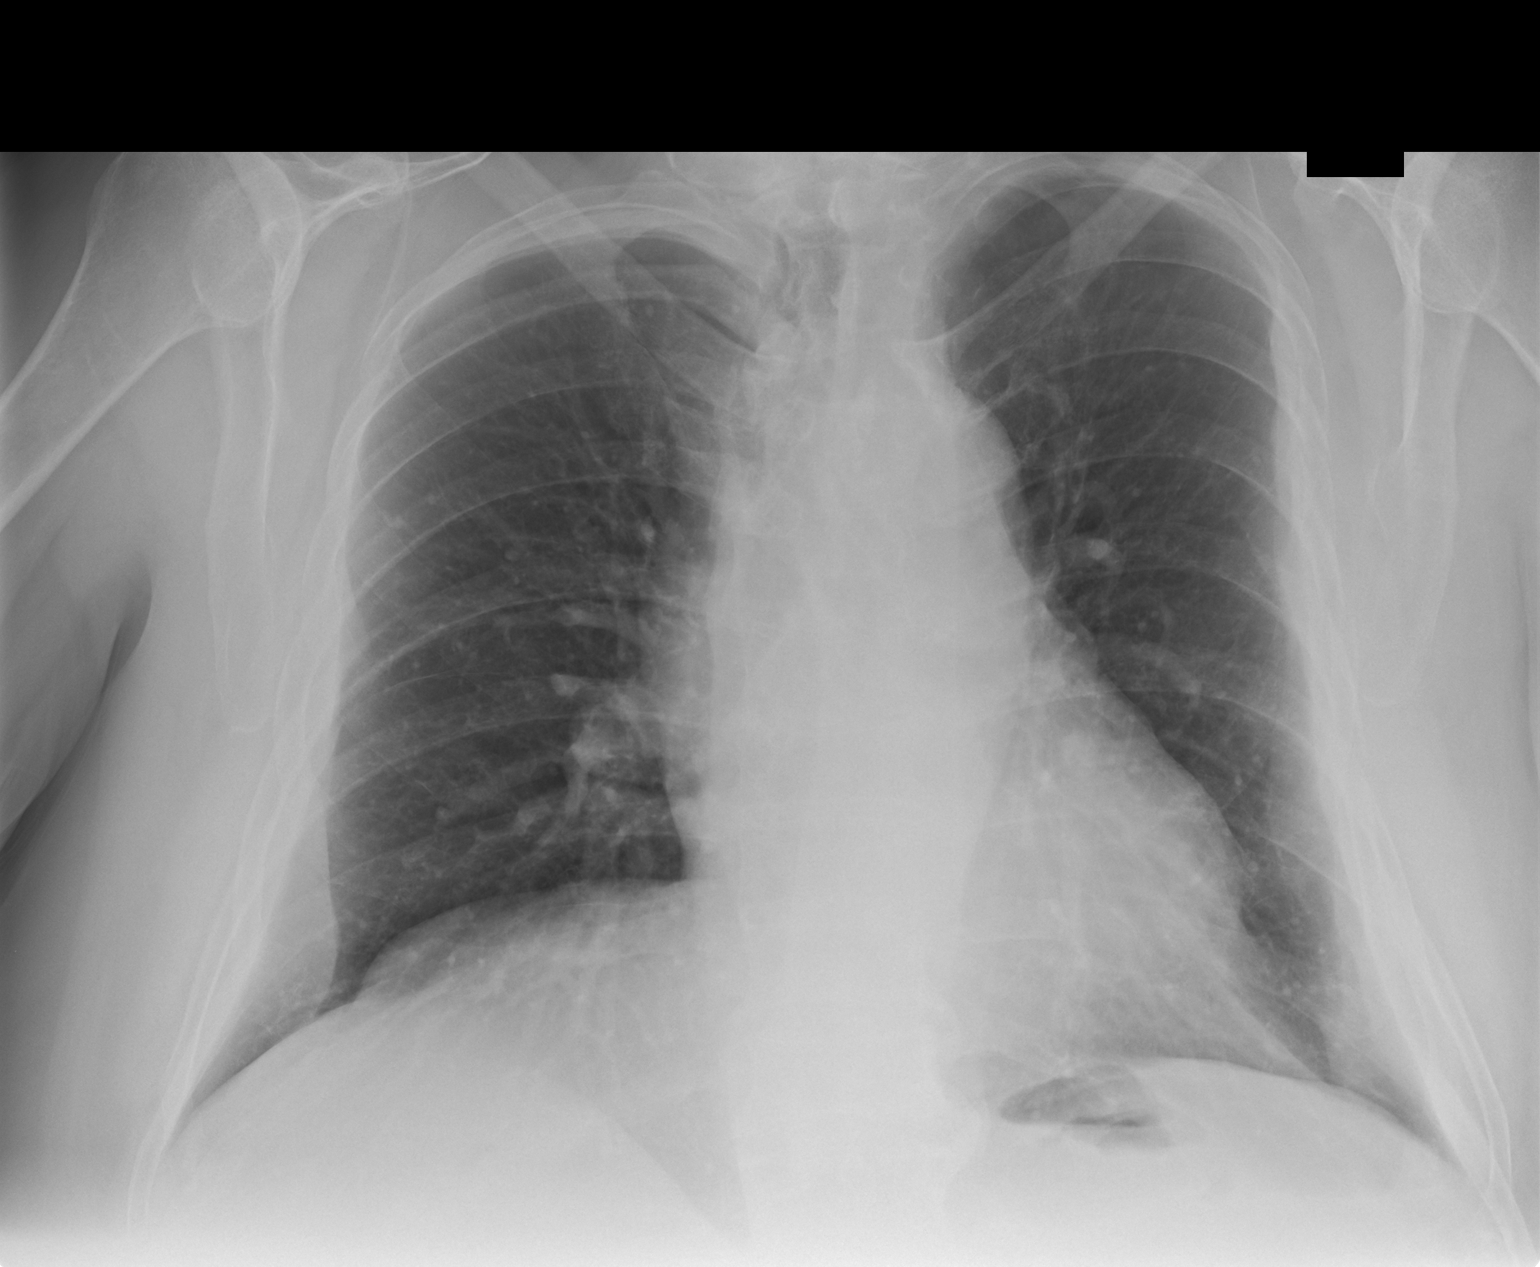

[chest lat]
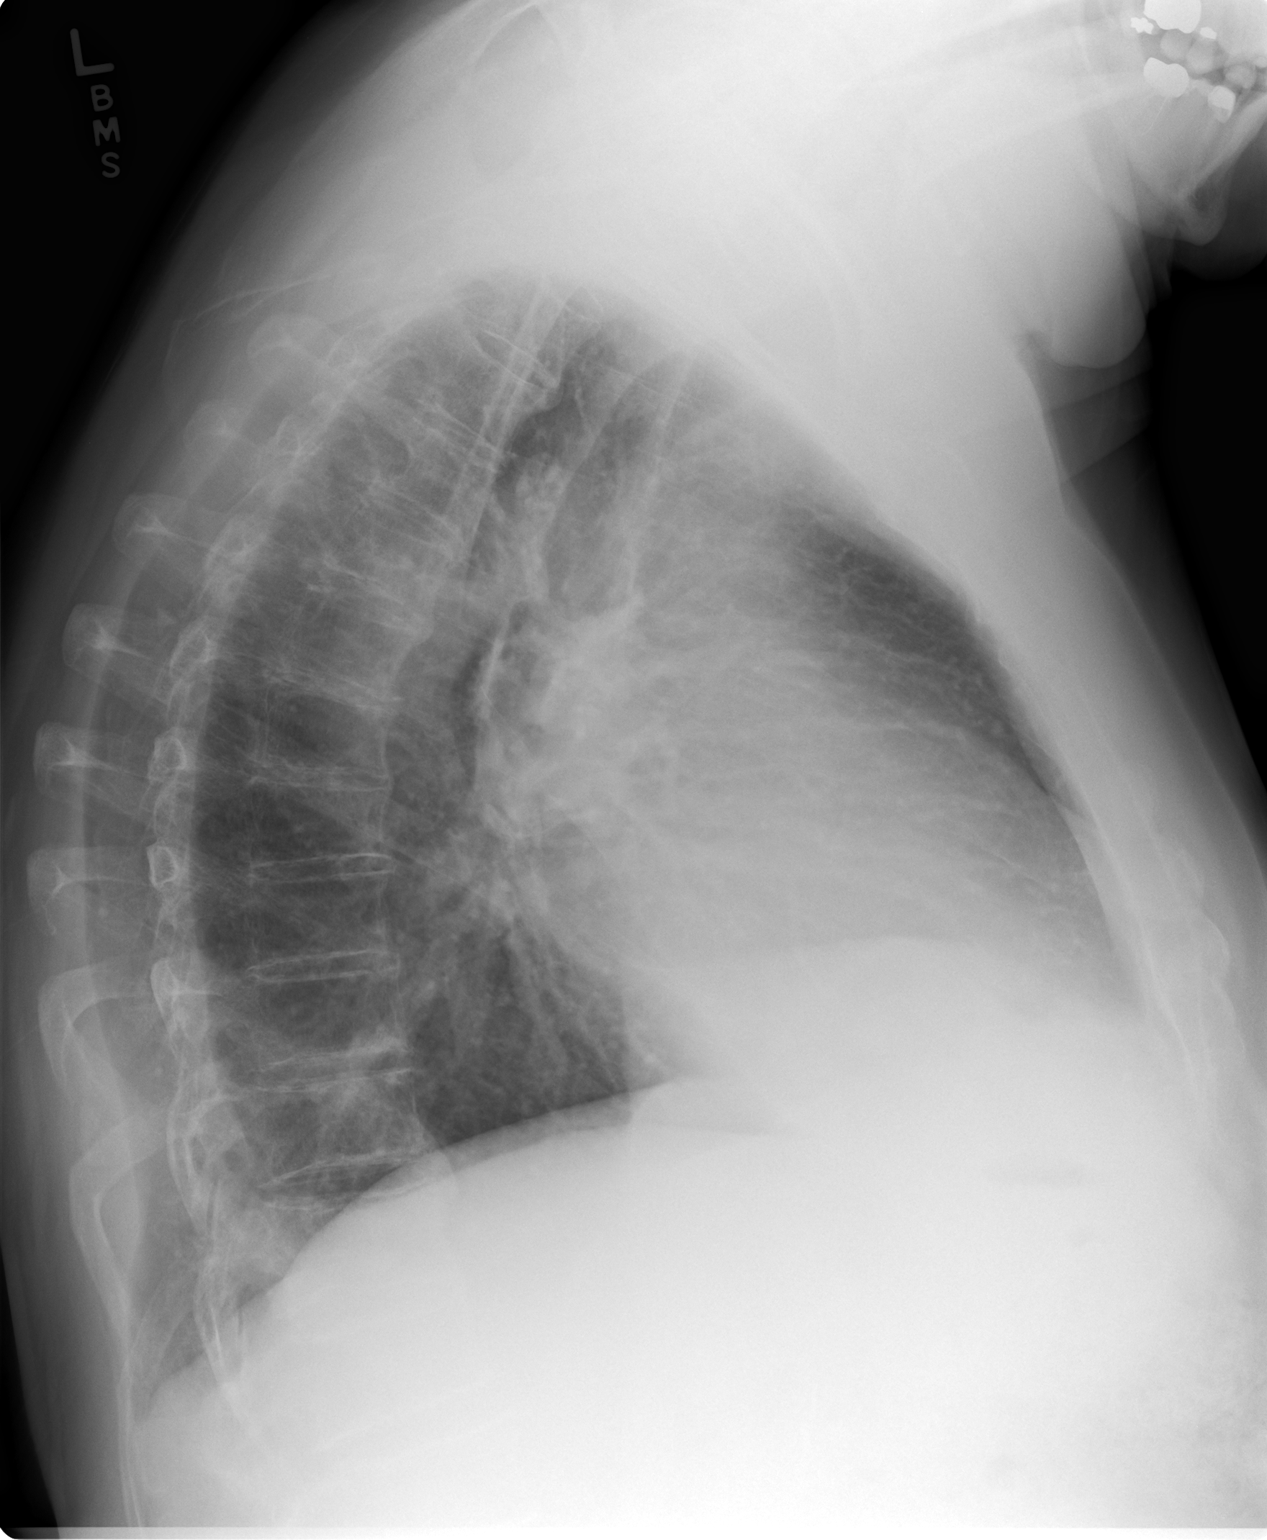

[2 of 2 positions shown; findings below may reference images not displayed]

DIAGNOSTIC STUDIES

EXAM

XR chest 2V

INDICATION

cough after broken ribs,sputum production
08/18/17. CF/BS

TECHNIQUE

PA and lateral views of the chest were obtained.

COMPARISONS

August 18, 2017

FINDINGS

Borderline cardiomegaly is unchanged. No acute infiltrates are seen. Bony thorax is stable.

IMPRESSION

No acute infiltrates throughout the chest.

Tech Notes:

08/18/17. CF/BS

## 2021-05-11 ENCOUNTER — Encounter: Admit: 2021-05-11 | Discharge: 2021-05-11 | Payer: MEDICARE

## 2021-05-18 ENCOUNTER — Encounter: Admit: 2021-05-18 | Discharge: 2021-05-18 | Payer: MEDICARE

## 2021-05-18 ENCOUNTER — Ambulatory Visit: Admit: 2021-05-18 | Discharge: 2021-05-19 | Payer: MEDICARE

## 2021-05-18 DIAGNOSIS — I483 Typical atrial flutter: Secondary | ICD-10-CM

## 2021-05-18 DIAGNOSIS — R079 Chest pain, unspecified: Secondary | ICD-10-CM

## 2021-05-18 DIAGNOSIS — M1990 Unspecified osteoarthritis, unspecified site: Secondary | ICD-10-CM

## 2021-05-18 DIAGNOSIS — Z8669 Personal history of other diseases of the nervous system and sense organs: Secondary | ICD-10-CM

## 2021-05-18 DIAGNOSIS — R0989 Other specified symptoms and signs involving the circulatory and respiratory systems: Secondary | ICD-10-CM

## 2021-05-18 DIAGNOSIS — I4892 Unspecified atrial flutter: Secondary | ICD-10-CM

## 2021-05-18 DIAGNOSIS — E119 Type 2 diabetes mellitus without complications: Secondary | ICD-10-CM

## 2021-05-18 DIAGNOSIS — N189 Chronic kidney disease, unspecified: Secondary | ICD-10-CM

## 2021-05-18 DIAGNOSIS — Z9889 Other specified postprocedural states: Secondary | ICD-10-CM

## 2021-05-18 DIAGNOSIS — I10 Essential (primary) hypertension: Secondary | ICD-10-CM

## 2021-05-18 DIAGNOSIS — F419 Anxiety disorder, unspecified: Secondary | ICD-10-CM

## 2021-05-18 DIAGNOSIS — F32A Depression: Secondary | ICD-10-CM

## 2021-05-18 DIAGNOSIS — E669 Obesity, unspecified: Secondary | ICD-10-CM

## 2021-05-18 DIAGNOSIS — I4891 Unspecified atrial fibrillation: Secondary | ICD-10-CM

## 2021-05-18 DIAGNOSIS — E785 Hyperlipidemia, unspecified: Secondary | ICD-10-CM

## 2021-05-18 NOTE — Progress Notes
Date of Service: 05/18/2021    Dan Mccarty is a 76 y.o. male.       HPI    Dan Mccarty is followed for hypertension, palpitations, paroxysmal atrial flutter, non-obstructive coronary artery disease and diabetes mellitus.? He developed bronchitis several days ago and is being treated with a cephalosporin.  To his knowledge he has not contracted COVID. ?The patient reports that his blood sugars are now under good control and that his last hemoglobin A1c was 5.9% % on 05/09/2021.[name] His blood pressure is well controlled when he checks it at home and generally runs in the range of 120/70 mmHg. Otherwise, over the past?6?months, the patient has been doing well and he reports no angina, congestive symptoms, palpitations, sensation of sustained forceful heart pounding, lightheadedness or syncope. His exercise tolerance has?been stable and he walks for 1 to 2 miles on days when the weather is nice.  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 The patient reports no myalgias, claudication,?bleeding abnormalities, neurologic motor abnormalities or difficulty with speech.?Dan Mccarty?reports a history of non-insulin-requiring diabetes mellitus.??He reports no history of myocardial infarction, congestive heart failure, severe renal insufficiency, transient ischemic attack or stroke.?  ?  Historically, Dan Mccarty was seen in the office on 09/12/10 for chest discomfort and a lightheaded sensation. He was found to be in atrial flutter with a rapid ventricular response rate and was hospitalized at [facility] where he underwent TEE-guided cardioversion and then stress testing. In late November 2012 he developed fatigue and anxiety and when seen on 02/27/11 was noted to be back in atrial flutter with 2:1 conduction and a ventricular response of 130 BPM. He was thought to have typical counter-clockwise flutter and cavo-tricuspid isthmus ablation was performed on 03/01/11 with excellent results. When I saw Dan Mccarty in January 2013, I changed his beta-blocker from metoprolol to carvedilol for better antihypertensive control. Dan Mccarty was seen in the office on 12/25/12 for flushing and palpitations. A Holter monitor was obtained that showed an episode of non-sustained ventricular tachycardia. Then a stress test was obtained that was abnormal. On 01/28/13 he reported left arm and chest discomfort. Coronary angiography was performed on 01/29/13 which showed non-obstructive coronary artery disease that did not require intervention. On 03/05/13 Dan Mccarty underwent left inguinal hernia repair without complication.?On 02/28/2017 he underwent left total hip arthroplasty and reports excellent results.??He?then underwent?right total hip arthroplasty on May 28, 2017.??Surgery was uncomplicated except that Dan Mccarty fell on Aug 18, 2017 and fractured his right prosthetic hip. ?On 08/20/2017 he underwent open reduction-internal fixation of his right femur fracture?and revision of his total hip arthroplasty. His wife of 51 years passed away in 03-29-21from complications related to chronic renal failure. He has had appropriate grieving response.?       Vitals:    05/18/21 0852   BP: 126/80   BP Source: Arm, Left Upper   Pulse: 62   SpO2: 100%   O2 Device: None (Room air)   PainSc: Zero   Weight: 112.9 kg (249 lb)   Height: 182.9 cm (6')     Body mass index is 33.77 kg/m?[name]     Past Medical History  Patient Active Problem List    Diagnosis Date Noted   ? 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 Impaired mobility and activities of daily living 08/21/2017   ? Acute pain due to trauma 08/19/2017   ? 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  Closed right hip fracture (HCC) 08/18/2017   ? Periprosthetic hip fracture 08/18/2017   ? Hip dislocation, right, initial encounter (HCC) 08/18/2017   ? 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 S/P ablation of atrial flutter 01/29/2013     2012 by Dr. Wallene Huh     ? Atrial flutter (HCC) 02/22/2011   ? Obesity 09/12/2010   ? Depression 09/12/2010   ? Anxiety 09/12/2010   ? Diabetes (HCC) 09/12/2010   ? Chest pain 09/20/2006     09/19/06 Heart Cath:   1. Normal left ventricular size, systolic function and hemodynamics.   2. Anterolateral hypokinesis.   3. Mild to moderate 3 vessel coronary disease with some associated coronary ectasia       ? Hyperlipidemia 09/20/2006   ? Hypertension 09/20/2006         Review of Systems   Constitutional: Negative.   HENT: Negative.    Eyes: Negative.    Cardiovascular: Negative.    Respiratory: Positive for cough.    Endocrine: Negative.    Hematologic/Lymphatic: Negative.    Skin: Negative.    Musculoskeletal: Negative.    Gastrointestinal: Negative.    Genitourinary: Negative.    Neurological: Negative.    Psychiatric/Behavioral: Negative.    Allergic/Immunologic: Negative.        Physical Exam  GENERAL: The patient is well developed, well nourished, resting comfortably and in no distress. ?  HEENT: No abnormalities of the visible oro-nasopharynx, conjunctiva or sclera are noted. ?  NECK: There is no jugular venous distension. Carotids are palpable and without bruits. There is no thyroid enlargement. ?  Chest: Lung fields are clear to auscultation. There are no wheezes or crackles. ?  CV: There is a regular rhythm. The first and second heart sounds are normal. There are no murmurs, gallops or rubs. His apical heart rate is?64?beats per minute. ?  ABD: The abdomen is soft and supple with normal bowel sounds. There is no hepatosplenomegaly, ascites, tenderness, masses or bruits. ?  Neuro: There are no focal motor defects. Ambulation is?nearly?normal. Cognitive function appears normal. ?  Ext:?There is no edema or evidence of deep vein thrombosis. Peripheral pulses are satisfactory. ?  SKIN:?There are no rashes and no cellulitis ?  PSYCH:?The patient is calm, rationale and oriented.    Cardiovascular Studies  A twelve-lead ECG was obtained on 05/18/2021 and reveals normal sinus rhythm with a heart rate of 75 bpm.  First-degree AV block is noted with a PR interval of 238 ms.  There is no evidence of myocardial ischemia or infarction.  An echo Doppler study was performed on 02/08/2017 and revealed:  1. Endocardial definition is limited even with the use of contrast. ?However, no left ventricular regional wall motion abnormalities are seen. Overall LV systolic function appears normal. The estimated left ventricular ejection fraction appears to be in the range of 60%.  2. There is mild concentric left ventricular hypertrophy.  3. Right ventricular systolic function appears visually normal, although the TAPSE is reduced.  4. Mild right atrial enlargement.  5. Cardiac valve structures are not visualized well. ?However, there is no evidence of significant valvular regurgitation or stenosis by doppler exam.  6. A trivial pericardial effusion or epicardial fat pad is noted adjacent to the right ventricle.  ?  A?regadenoson thallium stress test was performed on 02/11/2017 revealed:  Scintigraphic (planar/tomographic):??There is mild intensity, small sized reversible perfusion defect involving mid to basal inferior wall. ?There is  normal homogenous uptake of thallium in all other myocardial segments. ?There are no other perfusion defects. ?All myocardial segments appear viable. Polar coordinate map identifies no perfusion abnormalities. Summed Stress Score: ?5, Summed Rest Score: ?0.?Regional Wall Thickening and Motion Post Stress: ??There is normal left ventricular wall motion and thickening of all myocardial segments qualitatively. Left Ventricular Ejection Fraction (post stress, in the resting state) =??57 %. Left Ventricular End Diastolic Volume: 73 mL.?SUMMARY/OPINION:??This study is probably normal without convincing evidence of significant myocardial ischemia. There is mild intensity, small sized reversible perfusion defect involving mid to basal inferior wall. It is likely secondary to ?diaphragmatic attenuation. ?Left ventricular systolic function is normal. There are no high risk prognostic indicators present. ?The ECG portion of the study is negative for ischemia. In aggregate the current study is low risk in regards to predicted annual cardiovascular mortality rate.?  Cardiovascular Health Factors  Vitals BP Readings from Last 3 Encounters:   05/18/21 126/80   03/15/20 (!) 150/88   09/14/19 117/67     Wt Readings from Last 3 Encounters:   05/18/21 112.9 kg (249 lb)   03/15/20 111.2 kg (245 lb 3.2 oz)   09/14/19 111.2 kg (245 lb 3.2 oz)     BMI Readings from Last 3 Encounters:   05/18/21 33.77 kg/m?   03/15/20 33.26 kg/m?   09/14/19 33.26 kg/m?      Smoking Social History     Tobacco Use   Smoking Status Former   ? Years: 4.00   ? Types: Cigarettes   ? Quit date: 02/28/1970   ? Years since quitting: 51.2   Smokeless Tobacco Never      Lipid Profile Cholesterol   Date Value Ref Range Status   08/02/2020 117  Final     HDL   Date Value Ref Range Status   08/02/2020 38 (L) >40 Final     LDL   Date Value Ref Range Status   08/02/2020 58  Final     Triglycerides   Date Value Ref Range Status   08/02/2020 104  Final      Blood Sugar Hemoglobin A1C   Date Value Ref Range Status   08/02/2020 6.0 (H) <5.7 Final     Glucose   Date Value Ref Range Status   08/02/2020 134 (H) 70 - 105 Final   01/05/2020 119 (H) 70 - 105 Final   12/09/2018 119 (H) 70 - 105 Final   09/12/2010 DUPLICATE ORDER 70 - 100 MG/DL Final     Glucose Fasting   Date Value Ref Range Status   01/03/2021 121 (H) 70 - 105 Final     Glucose, POC   Date Value Ref Range Status   08/23/2017 153 (H) 70 - 100 MG/DL Final   16/12/9602 540 (H) 70 - 100 MG/DL Final   98/01/9146 829 (H) 70 - 100 MG/DL Final          Problems Addressed Today  Encounter Diagnoses   Name Primary?   ? Cardiovascular symptoms Yes   ? Typical atrial flutter (HCC)    ? Primary hypertension Assessment and Plan    Mr.?Mccarty appears stable from a cardiovascular perspective. He reports no angina or congestive symptoms. He indicates that his blood pressure is well controlled.  His LDL cholesterol appears well controlled and his diabetes appears well controlled.  I have asked the patient to keep a log book of his BP readings and to report BP readings exceeding 130/80 mm Hg. Regular?mild?aerobic exercise, weight  loss and adherence to a heart healthy diet were recommended.??I have asked him?to return for follow-up in 12?months. The total time spent during this interview and exam with preparation and chart review was 30 minutes.  .?         Current Medications (including today's revisions)  ? acetaminophen (TYLENOL) 500 mg tablet Take two tablets by mouth every 6 hours as needed for Pain. Max of 4,000 mg of acetaminophen in 24 hours.   ? aspirin EC 81 mg tablet Take one tablet by mouth twice daily. Take with food. (Patient taking differently: Take one tablet by mouth daily. Take with food.)   ? atorvastatin (LIPITOR) 40 mg tablet Take one tablet by mouth twice daily.   ? carvediloL (COREG) 3.125 mg tablet TAKE 1 TABLET BY MOUTH  TWICE DAILY WITH MEALS   ? citalopram (CELEXA) 20 mg tablet Take one tablet by mouth every morning.   ? fish oil- omega 3-DHA/EPA 300/1,000 mg capsule Take one capsule by mouth twice daily.   ? metFORMIN (GLUCOPHAGE) 1,000 mg tablet Take one tablet by mouth twice daily.   ? multivitamin cmb No.21-iron-folic acid (CENTRUM) 18-400 mg-mcg tablet Take one tablet by mouth daily.   ? senna/docusate (SENOKOT-S) 8.6/50 mg tablet Take one tablet by mouth twice daily.   ? valsartan (DIOVAN) 160 mg tablet TAKE 1 TABLET BY MOUTH  DAILY   ? vit A/vit C/vit E/zinc/copper (OCUVITE PRESERVISION PO) Take  by mouth.

## 2021-05-18 NOTE — Patient Instructions
Follow up in 1 year    Follow up as directed.  Call sooner if issues.  Call the [facility] nursing line at [phone].  Leave a detailed message for the nurse in [facility] with how we can assist you and we will call you back.

## 2021-08-27 ENCOUNTER — Encounter: Admit: 2021-08-27 | Discharge: 2021-08-27 | Payer: MEDICARE

## 2021-08-27 MED ORDER — CARVEDILOL 3.125 MG PO TAB
ORAL_TABLET | 3 refills
Start: 2021-08-27 — End: ?

## 2021-08-27 MED ORDER — VALSARTAN 160 MG PO TAB
ORAL_TABLET | 3 refills
Start: 2021-08-27 — End: ?

## 2021-08-28 ENCOUNTER — Encounter: Admit: 2021-08-28 | Discharge: 2021-08-28 | Payer: MEDICARE

## 2021-08-28 MED ORDER — VALSARTAN 160 MG PO TAB
ORAL_TABLET | 3 refills
Start: 2021-08-28 — End: ?

## 2021-08-28 MED ORDER — CARVEDILOL 3.125 MG PO TAB
ORAL_TABLET | 3 refills
Start: 2021-08-28 — End: ?

## 2022-07-28 ENCOUNTER — Encounter: Admit: 2022-07-28 | Discharge: 2022-07-28 | Payer: MEDICARE

## 2022-07-28 MED ORDER — VALSARTAN 160 MG PO TAB
ORAL_TABLET | 3 refills
Start: 2022-07-28 — End: ?

## 2022-07-28 MED ORDER — CARVEDILOL 3.125 MG PO TAB
ORAL_TABLET | 3 refills
Start: 2022-07-28 — End: ?

## 2022-10-23 ENCOUNTER — Encounter: Admit: 2022-10-23 | Discharge: 2022-10-23 | Payer: MEDICARE

## 2022-10-23 DIAGNOSIS — N189 Chronic kidney disease, unspecified: Secondary | ICD-10-CM

## 2022-10-23 DIAGNOSIS — E119 Type 2 diabetes mellitus without complications: Secondary | ICD-10-CM

## 2022-10-23 DIAGNOSIS — I4891 Unspecified atrial fibrillation: Secondary | ICD-10-CM

## 2022-10-23 DIAGNOSIS — I10 Essential (primary) hypertension: Secondary | ICD-10-CM

## 2022-10-23 DIAGNOSIS — R0989 Other specified symptoms and signs involving the circulatory and respiratory systems: Secondary | ICD-10-CM

## 2022-10-23 DIAGNOSIS — F419 Anxiety disorder, unspecified: Secondary | ICD-10-CM

## 2022-10-23 DIAGNOSIS — Z8669 Personal history of other diseases of the nervous system and sense organs: Secondary | ICD-10-CM

## 2022-10-23 DIAGNOSIS — E785 Hyperlipidemia, unspecified: Secondary | ICD-10-CM

## 2022-10-23 DIAGNOSIS — I483 Typical atrial flutter: Secondary | ICD-10-CM

## 2022-10-23 DIAGNOSIS — I4892 Unspecified atrial flutter: Secondary | ICD-10-CM

## 2022-10-23 DIAGNOSIS — F32A Depression: Secondary | ICD-10-CM

## 2022-10-23 DIAGNOSIS — R079 Chest pain, unspecified: Secondary | ICD-10-CM

## 2022-10-23 DIAGNOSIS — M1990 Unspecified osteoarthritis, unspecified site: Secondary | ICD-10-CM

## 2022-10-23 DIAGNOSIS — E669 Obesity, unspecified: Secondary | ICD-10-CM

## 2022-10-23 DIAGNOSIS — Z9889 Other specified postprocedural states: Secondary | ICD-10-CM

## 2022-10-23 NOTE — Patient Instructions
Thank you for visiting our office today.    We would like to make the following medication adjustments:  NONE       Otherwise continue the same medications as you have been doing.          We will be pursuing the following tests after your appointment today:       Orders Placed This Encounter    ECG 12-LEAD         We will plan to see you back in 12 months.  Please call us in the meantime with any questions or concerns.        Please allow 5-7 business days for our providers to review your results. All normal results will go to MyChart. If you do not have Mychart, it is strongly recommended to get this so you can easily view all your results. If you do not have mychart, we will attempt to call you once with normal lab and testing results. If we cannot reach you by phone with normal results, we will send you a letter.  If you have not heard the results of your testing after one week please give us a call.       Your Cardiovascular Medicine [place] Team (Steve, Lisa, Jamie, Melanie, and Devaunte Gasparini)  phone number is [phone].

## 2022-10-23 NOTE — Progress Notes
Date of Service: 10/23/2022    Kasen Rammer is a 77 y.o. male.       HPI    Mr. Stahlberg is followed for hypertension, palpitations, paroxysmal atrial flutter, non-obstructive coronary artery disease and diabetes mellitus.  The patient reports that his blood sugars are now under good control and that his last hemoglobin A1c was 5.4% % on 07/31/22. His blood pressure is well controlled when he checks it at home and generally runs in the range of 120/70 mmHg.  He passed a kidney stone this morning but without a whole lot of discomfort.  Otherwise, over the past 6 months, the patient has been doing well and he reports no angina, congestive symptoms, palpitations, sensation of sustained forceful heart pounding, lightheadedness or syncope. His exercise tolerance has been stable and he walks for 1 to 2 miles on days when the weather is nice.  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 The patient reports no myalgias, claudication, bleeding abnormalities, neurologic motor abnormalities or difficulty with speech. Mr. Ericksen reports a history of non-insulin-requiring diabetes mellitus.  He reports no history of myocardial infarction, congestive heart failure, severe renal insufficiency, transient ischemic attack or stroke.      Historically, Mr. Flaig was seen in the office on 09/12/10 for chest discomfort and a lightheaded sensation. He was found to be in atrial flutter with a rapid ventricular response rate and was hospitalized at [facility] where he underwent TEE-guided cardioversion and then stress testing. In late November 2012 he developed fatigue and anxiety and when seen on 02/27/11 was noted to be back in atrial flutter with 2:1 conduction and a ventricular response of 130 BPM. He was thought to have typical counter-clockwise flutter and cavo-tricuspid isthmus ablation was performed on 03/01/11 with excellent results. When I saw Mr. Wilke in January 2013, I changed his beta-blocker from metoprolol to carvedilol for better antihypertensive control. Mr. Domminic Kren was seen in the office on 12/25/12 for flushing and palpitations. A Holter monitor was obtained that showed an episode of non-sustained ventricular tachycardia. Then a stress test was obtained that was abnormal. On 01/28/13 he reported left arm and chest discomfort. Coronary angiography was performed on 01/29/13 which showed non-obstructive coronary artery disease that did not require intervention. On 03/05/13 Mr. Bencivenga underwent left inguinal hernia repair without complication. On 02/28/2017 he underwent left total hip arthroplasty and reports excellent results.  He then underwent right total hip arthroplasty on May 28, 2017.  Surgery was uncomplicated except that Mr. Gervase fell on Aug 18, 2017 and fractured his right prosthetic hip.  On 08/20/2017 he underwent open reduction-internal fixation of his right femur fracture and revision of his total hip arthroplasty. His wife of 51 years passed away in 03-12-2021from complications related to chronic renal failure. He has had appropriate grieving response.        Vitals:    10/23/22 1242   BP: 121/73   BP Source: Arm, Left Upper   Pulse: 92   SpO2: 97%   O2 Device: None (Room air)   PainSc: Zero   Weight: 108.3 kg (238 lb 12.8 oz)   Height: 182.9 cm (6')     Body mass index is 32.39 kg/m?[name]     Past Medical History  Patient Active Problem List    Diagnosis Date Noted    Acute kidney injury superimposed on chronic kidney disease  (HCC) 08/21/2017    Impaired mobility and activities of daily living 08/21/2017  Acute pain due to trauma 08/19/2017    Frailty syndrome in geriatric patient 08/19/2017    Closed right hip fracture (HCC) 08/18/2017    Periprosthetic hip fracture 08/18/2017    Hip dislocation, right, initial encounter (HCC) 08/18/2017    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 Wallene Huh      Atrial flutter (HCC) 02/22/2011    Obesity 09/12/2010    Depression 09/12/2010    Anxiety 09/12/2010    Diabetes (HCC) 09/12/2010    Chest pain 09/20/2006     09/19/06 Heart Cath:   1. Normal left ventricular size, systolic function and hemodynamics.   2. Anterolateral hypokinesis.   3. Mild to moderate 3 vessel coronary disease with some associated coronary ectasia        Hyperlipidemia 09/20/2006    Hypertension 09/20/2006         Review of Systems   Constitutional: Negative.   HENT: Negative.     Eyes: Negative.    Cardiovascular: Negative.    Respiratory: Negative.     Endocrine: Negative.    Hematologic/Lymphatic: Negative.    Skin: Negative.    Musculoskeletal: Negative.    Gastrointestinal: Negative.    Genitourinary: Negative.    Neurological: Negative.    Psychiatric/Behavioral: Negative.     Allergic/Immunologic: Negative.        Physical Exam  GENERAL: The patient is well developed, well nourished, resting comfortably and in no distress.    HEENT: No abnormalities of the visible oro-nasopharynx, conjunctiva or sclera are noted.    NECK: There is no jugular venous distension. Carotids are palpable and without bruits. There is no thyroid enlargement.    Chest: Lung fields are clear to auscultation. There are no wheezes or crackles.    CV: There is a regular rhythm. The first and second heart sounds are normal. There are no murmurs, gallops or rubs. His apical heart rate is 84 beats per minute.    ABD: The abdomen is soft and supple with normal bowel sounds. There is no hepatosplenomegaly, ascites, tenderness, masses or bruits.    Neuro: There are no focal motor defects. Ambulation is nearly normal. Cognitive function appears normal.    Ext: There is no edema or evidence of deep vein thrombosis. Peripheral pulses are satisfactory.    SKIN: There are no rashes and no cellulitis    PSYCH: The patient is calm, rationale and oriented.    Cardiovascular Studies  A twelve-lead ECG was obtained on 10/23/2022 reveals normal sinus rhythm with a heart rate of 91 bpm.  First-degree AV block is noted with PR = 218 ms.  A premature supraventricular ectopic beat is noted along with a ventricular fusion beat.    From 07/31/2022 revealed hemoglobin = 13.8 g/dL potassium 4.3 mmol/L, creatinine 1.01 mg/dL.  ALT = 23.  Total cholesterol = 113, triglycerides 100, HDL 39 and LDL cholesterol 54 mg/dL.  His TSH was 1.41 microunits/mL.    An echo Doppler study was performed on 02/08/2017 and revealed:  Endocardial definition is limited even with the use of contrast.  However, no left ventricular regional wall motion abnormalities are seen. Overall LV systolic function appears normal. The estimated left ventricular ejection fraction appears to be in the range of 60%.  There is mild concentric left ventricular hypertrophy.  Right ventricular systolic function appears visually normal, although the TAPSE is reduced.  Mild right atrial enlargement.  Cardiac valve structures are not visualized well.  However, there is no evidence of significant valvular regurgitation or stenosis by doppler exam.  A trivial pericardial effusion or epicardial fat pad is noted adjacent to the right ventricle.     A regadenoson thallium stress test was performed on 02/11/2017 revealed:  Scintigraphic (planar/tomographic):  There is mild intensity, small sized reversible perfusion defect involving mid to basal inferior wall.  There is normal homogenous uptake of thallium in all other myocardial segments.  There are no other perfusion defects.  All myocardial segments appear viable. Polar coordinate map identifies no perfusion abnormalities. Summed Stress Score:  5, Summed Rest Score:  0. Regional Wall Thickening and Motion Post Stress:   There is normal left ventricular wall motion and thickening of all myocardial segments qualitatively. Left Ventricular Ejection Fraction (post stress, in the resting state) =  57 %. Left Ventricular End Diastolic Volume: 73 mL. SUMMARY/OPINION: This study is probably normal without convincing evidence of significant myocardial ischemia. There is mild intensity, small sized reversible perfusion defect involving mid to basal inferior wall. It is likely secondary to  diaphragmatic attenuation.  Left ventricular systolic function is normal. There are no high risk prognostic indicators present.  The ECG portion of the study is negative for ischemia. In aggregate the current study is low risk in regards to predicted annual cardiovascular mortality rate.   Cardiovascular Health Factors  Vitals BP Readings from Last 3 Encounters:   10/23/22 121/73   05/18/21 126/80   03/15/20 (!) 150/88     Wt Readings from Last 3 Encounters:   10/23/22 108.3 kg (238 lb 12.8 oz)   05/18/21 112.9 kg (249 lb)   03/15/20 111.2 kg (245 lb 3.2 oz)     BMI Readings from Last 3 Encounters:   10/23/22 32.39 kg/m?   05/18/21 33.77 kg/m?   03/15/20 33.26 kg/m?      Smoking Social History     Tobacco Use   Smoking Status Former    Current packs/day: 0.00    Types: Cigarettes    Start date: 02/28/1966    Quit date: 02/28/1970    Years since quitting: 52.6   Smokeless Tobacco Never      Lipid Profile Cholesterol   Date Value Ref Range Status   08/02/2020 117  Final     HDL   Date Value Ref Range Status   08/02/2020 38 (L) >40 Final     LDL   Date Value Ref Range Status   08/02/2020 58  Final     Triglycerides   Date Value Ref Range Status   08/02/2020 104  Final      Blood Sugar Hemoglobin A1C   Date Value Ref Range Status   08/02/2020 6.0 (H) <5.7 Final     Glucose   Date Value Ref Range Status   08/02/2020 134 (H) 70 - 105 Final   01/05/2020 119 (H) 70 - 105 Final   12/09/2018 119 (H) 70 - 105 Final   09/12/2010 DUPLICATE ORDER 70 - 100 MG/DL Final     Glucose Fasting   Date Value Ref Range Status   01/03/2021 121 (H) 70 - 105 Final     Glucose, POC   Date Value Ref Range Status   08/23/2017 153 (H) 70 - 100 MG/DL Final   16/12/9602 540 (H) 70 - 100 MG/DL Final   98/01/9146 829 (H) 70 - 100 MG/DL Final  Problems Addressed Today  Encounter Diagnoses   Name Primary?    Typical atrial flutter (HCC)     Primary hypertension     Cardiovascular symptoms        Assessment and Plan   Mr. Catlow appears stable from a cardiovascular perspective. He reports no angina or congestive symptoms. He indicates that his blood pressure is well controlled.  His LDL cholesterol appears well controlled and his diabetes appears well controlled.  I have asked the patient to keep a log book of his BP readings and to report BP readings exceeding 130/80 mm Hg.  Cardiovascular risk factor modification was discussed in detail.  Regular mild aerobic exercise, weight loss and adherence to a heart healthy diet were recommended.  I have asked him to return for follow-up in 12 months. The total time spent during this interview and exam with preparation and chart review was 30 minutes.            Current Medications (including today's revisions)   acetaminophen (TYLENOL) 500 mg tablet Take two tablets by mouth every 6 hours as needed for Pain. Max of 4,000 mg of acetaminophen in 24 hours.    aspirin EC 81 mg tablet Take one tablet by mouth twice daily. Take with food. (Patient taking differently: Take one tablet by mouth daily. Take with food.)    atorvastatin (LIPITOR) 40 mg tablet Take one tablet by mouth twice daily.    carvediloL (COREG) 3.125 mg tablet TAKE 1 TABLET BY MOUTH TWICE  DAILY WITH MEALS    citalopram (CELEXA) 20 mg tablet Take one tablet by mouth every morning.    fish oil- omega 3-DHA/EPA 300/1,000 mg capsule Take one capsule by mouth twice daily.    metFORMIN (GLUCOPHAGE) 1,000 mg tablet Take one tablet by mouth twice daily.    multivitamin cmb No.21-iron-folic acid (CENTRUM) 18-400 mg-mcg tablet Take one tablet by mouth daily.    senna/docusate (SENOKOT-S) 8.6/50 mg tablet Take one tablet by mouth twice daily.    sildenafiL (VIAGRA) 25 mg tablet Take one tablet by mouth as Needed for Erectile dysfunction. valsartan (DIOVAN) 160 mg tablet TAKE 1 TABLET BY MOUTH DAILY    vit A/vit C/vit E/zinc/copper (OCUVITE PRESERVISION PO) Take  by mouth.

## 2023-06-07 ENCOUNTER — Encounter: Admit: 2023-06-07 | Discharge: 2023-06-07 | Payer: MEDICARE

## 2023-12-02 ENCOUNTER — Encounter: Admit: 2023-12-02 | Discharge: 2023-12-02 | Payer: MEDICARE

## 2023-12-04 ENCOUNTER — Encounter: Admit: 2023-12-04 | Discharge: 2023-12-04 | Payer: MEDICARE

## 2023-12-12 ENCOUNTER — Encounter: Admit: 2023-12-12 | Discharge: 2023-12-12 | Payer: MEDICARE

## 2023-12-12 VITALS — BP 124/80 | HR 66 | Ht 72.0 in | Wt 227.6 lb

## 2023-12-12 DIAGNOSIS — E7849 Other hyperlipidemia: Secondary | ICD-10-CM

## 2023-12-12 DIAGNOSIS — R011 Cardiac murmur, unspecified: Secondary | ICD-10-CM

## 2023-12-12 DIAGNOSIS — R0989 Other specified symptoms and signs involving the circulatory and respiratory systems: Principal | ICD-10-CM

## 2023-12-12 DIAGNOSIS — I10 Essential (primary) hypertension: Secondary | ICD-10-CM

## 2023-12-12 DIAGNOSIS — I483 Typical atrial flutter: Secondary | ICD-10-CM

## 2023-12-12 NOTE — Patient Instructions
 Thank you for visiting our office today.    We would like to make the following medication adjustments:  NONE       Otherwise continue the same medications as you have been doing.          We will be pursuing the following tests after your appointment today:       Orders Placed This Encounter    Basic Metabolic Panel    Lipid Profile    ECG 12-LEAD    2D + DOPPLER ECHO         We will plan to see you back in 6 months.  Please call us  in the meantime with any questions or concerns.        Please allow 5-7 business days for our providers to review your results. All normal results will go to MyChart. If you do not have Mychart, it is strongly recommended to get this so you can easily view all your results. If you do not have mychart, we will attempt to call you once with normal lab and testing results. If we cannot reach you by phone with normal results, we will send you a letter.  If you have not heard the results of your testing after one week please give us  a call.       Your Cardiovascular Medicine [place] Team [place], [place], [place], and [place])  phone number is [phone].

## 2023-12-12 NOTE — Progress Notes
 Date of Service: 12/12/2023    Dan Mccarty is a 78 y.o. male.       HPI   Dan Mccarty is followed for hypertension, palpitations, paroxysmal atrial flutter, non-obstructive coronary artery disease and diabetes mellitus.  He has a 101-year-old great granddaughter who lives with him.  They have a nice routine where she dances while he sings.  His blood pressure is well controlled when he checks it at home and generally runs in the range of 120/70 mmHg.  He reports passing another kidney stone in April 2025.  Otherwise, over the past 6 months, the patient has been doing well and he reports no angina, congestive symptoms, palpitations, sensation of sustained forceful heart pounding, lightheadedness or syncope. His exercise tolerance, although he has not been exercising that much since he has been taking care of his great granddaughter.  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 The patient reports no myalgias, claudication, bleeding abnormalities, or strokelike symptoms. Dan Mccarty reports a history of non-insulin -requiring diabetes mellitus.  He reports no history of myocardial infarction, congestive heart failure, severe renal insufficiency, transient ischemic attack or stroke.      Historically, Dan Mccarty was seen in the office on 09/12/10 for chest discomfort and a lightheaded sensation. He was found to be in atrial flutter with a rapid ventricular response rate and was hospitalized at [facility] where he underwent TEE-guided cardioversion and then stress testing. In late November 2012 he developed fatigue and anxiety and when seen on 02/27/11 was noted to be back in atrial flutter with 2:1 conduction and a ventricular response of 130 BPM. He was thought to have typical counter-clockwise flutter and cavo-tricuspid isthmus ablation was performed on 03/01/11 with excellent results. When I saw Dan Mccarty in January 2013, I changed his beta-blocker from metoprolol to carvedilol  for better antihypertensive control. Dan Mccarty was seen in the office on 12/25/12 for flushing and palpitations. A Holter monitor was obtained that showed an episode of non-sustained ventricular tachycardia. Then a stress test was obtained that was abnormal. On 01/28/13 he reported left arm and chest discomfort. Coronary angiography was performed on 01/29/13 which showed non-obstructive coronary artery disease that did not require intervention. On 03/05/13 Dan Mccarty underwent left inguinal hernia repair without complication. On 02/28/2017 he underwent left total hip arthroplasty and reports excellent results.  He then underwent right total hip arthroplasty on May 28, 2017.  Surgery was uncomplicated except that Dan Mccarty fell on Aug 18, 2017 and fractured his right prosthetic hip.  On 08/20/2017 he underwent open reduction-internal fixation of his right femur fracture and revision of his total hip arthroplasty. His wife of 51 years passed away in 04/09/21from complications related to chronic renal failure. He has had appropriate grieving response.        Vitals:    12/12/23 1417   BP: 124/80   BP Source: Arm, Left Upper   Pulse: 66   SpO2: 97%   O2 Device: None (Room air)   PainSc: Zero   Weight: 103.2 kg (227 lb 9.6 oz)   Height: 182.9 cm (6')     Body mass index is 30.87 kg/m?SABRA     Past Medical History  Patient Active Problem List    Diagnosis Date Noted    Acute kidney injury superimposed on chronic kidney disease 08/21/2017    Impaired mobility and activities of daily living 08/21/2017    Acute pain due to trauma 08/19/2017    Frailty syndrome in  geriatric patient 08/19/2017    Closed right hip fracture (CMS-HCC) 08/18/2017    Periprosthetic hip fracture 08/18/2017    Hip dislocation, right, initial encounter (CMS-HCC) 08/18/2017    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 Liborio      Atrial flutter (CMS-HCC) 02/22/2011    Obesity 09/12/2010    Depression 09/12/2010    Anxiety 09/12/2010    Diabetes (HCC) 09/12/2010    Chest pain 09/20/2006     09/19/06 Heart Cath:   1. Normal left ventricular size, systolic function and hemodynamics.   2. Anterolateral hypokinesis.   3. Mild to moderate 3 vessel coronary disease with some associated coronary ectasia        Hyperlipidemia 09/20/2006    Hypertension 09/20/2006         Review of Systems   Constitutional: Negative.   HENT: Negative.     Eyes: Negative.    Cardiovascular: Negative.    Respiratory: Negative.     Endocrine: Negative.    Hematologic/Lymphatic: Negative.    Skin: Negative.    Musculoskeletal: Negative.    Gastrointestinal: Negative.    Genitourinary: Negative.    Neurological: Negative.    Psychiatric/Behavioral: Negative.     Allergic/Immunologic: Negative.        Physical Exam  GENERAL: The patient is well developed, well nourished, resting comfortably and in no distress.    HEENT: No abnormalities of the visible oro-nasopharynx, conjunctiva or sclera are noted.    NECK: There is no jugular venous distension. Carotids are palpable and without bruits. There is no thyroid enlargement.    Chest: Lung fields are clear to auscultation. There are no wheezes or crackles.    CV: There is a regular rhythm. The first and second heart sounds are normal.  A soft systolic ejection murmur is heard.  There are no diastolic murmurs, gallops or rubs. His apical heart rate is 68 beats per minute.    ABD: The abdomen is soft and supple with normal bowel sounds. There is no hepatosplenomegaly, ascites, tenderness, masses or bruits.    Neuro: There are no focal motor defects. Ambulation is normal. Cognitive function appears normal.    Ext: There is no edema or evidence of deep vein thrombosis. Peripheral pulses are satisfactory.    SKIN: There are no rashes and no cellulitis    PSYCH: The patient is calm, rationale and oriented.    Cardiovascular Studies  A twelve-lead ECG obtained on 12/12/2023 shows sinus or atrial rhythm with a heart rate of 61 bpm.  There is no evidence of myocardial ischemia or infarction.  Labs from the time of his kidney stone on 06/24/2023 revealed serum creatinine = 1.79 mg/dL.  His potassium was 4.2 mmol/L his ALT = 18.    Cardiovascular Health Factors  Vitals BP Readings from Last 3 Encounters:   12/12/23 124/80   10/23/22 121/73   05/18/21 126/80     Wt Readings from Last 3 Encounters:   12/12/23 103.2 kg (227 lb 9.6 oz)   10/23/22 108.3 kg (238 lb 12.8 oz)   05/18/21 112.9 kg (249 lb)     BMI Readings from Last 3 Encounters:   12/12/23 30.87 kg/m?   10/23/22 32.39 kg/m?   05/18/21 33.77 kg/m?      Smoking Tobacco Use History[1]   Lipid Profile Cholesterol   Date Value Ref Range Status  07/31/2022 113  Final     HDL   Date Value Ref Range Status   07/31/2022 39 (L) >40 Final     LDL   Date Value Ref Range Status   07/31/2022 54  Final     Triglycerides   Date Value Ref Range Status   07/31/2022 100  Final      Blood Sugar Hemoglobin A1C   Date Value Ref Range Status   07/10/2023 5.9 (H) 4.5 - 6.5 Final     Glucose   Date Value Ref Range Status   06/24/2023 146 (H) 70 - 105 Final   07/31/2022 102  Final   08/02/2020 134 (H) 70 - 105 Final     Glucose Fasting   Date Value Ref Range Status   01/03/2021 121 (H) 70 - 105 Final     Glucose, POC   Date Value Ref Range Status   08/23/2017 153 (H) 70 - 100 MG/DL Final   94/68/7980 856 (H) 70 - 100 MG/DL Final   94/69/7980 831 (H) 70 - 100 MG/DL Final          Problems Addressed Today  Encounter Diagnoses   Name Primary?    Cardiovascular symptoms Yes    Typical atrial flutter (CMS-HCC)     Other hyperlipidemia     Primary hypertension     Murmur        Assessment and Plan   Subjectively Mr. Beer is doing very well.  This is the first time I have heard a soft heart murmur and I have asked him to obtain an echo Doppler study.  I have also asked him to repeat his Chem-7 to make sure that his renal function has improved following his kidney stone in April 2025.  I have also asked him to repeat his fasting lipid profile.  Cardiovascular risk factor management was reviewed in detail.  I have asked him to return for follow-up in 6 months time. The total time spent during this interview and exam with preparation and chart review was 30 minutes.         Current Medications (including today's revisions)   acetaminophen  (TYLENOL ) 500 mg tablet Take two tablets by mouth every 6 hours as needed for Pain. Max of 4,000 mg of acetaminophen  in 24 hours.    aspirin  EC 81 mg tablet Take one tablet by mouth twice daily. Take with food. (Patient taking differently: Take one tablet by mouth daily. Take with food.)    atorvastatin  (LIPITOR) 40 mg tablet Take one tablet by mouth twice daily. (Patient taking differently: Take one tablet by mouth daily.)    carvediloL  (COREG ) 3.125 mg tablet TAKE 1 TABLET BY MOUTH TWICE  DAILY WITH MEALS    citalopram  (CELEXA ) 20 mg tablet Take one tablet by mouth every morning.    fish oil- omega 3-DHA/EPA 300/1,000 mg capsule Take one capsule by mouth twice daily.    metFORMIN  (GLUCOPHAGE ) 1,000 mg tablet Take one tablet by mouth twice daily.    multivitamin cmb No.21-iron-folic acid (CENTRUM) 18-400 mg-mcg tablet Take one tablet by mouth daily.    senna/docusate (SENOKOT-S) 8.6/50 mg tablet Take one tablet by mouth twice daily.    sildenafiL (REVATIO) 20 mg tablet Take two tablets to three tablets by mouth as Needed.    valsartan  (DIOVAN ) 160 mg tablet TAKE 1 TABLET BY MOUTH DAILY    vit A/vit C/vit E/zinc /copper (OCUVITE PRESERVISION PO) Take 1 tablet by mouth twice daily.                 [  1]   Social History  Tobacco Use   Smoking Status Former    Current packs/day: 0.00    Types: Cigarettes    Start date: 02/28/1966    Quit date: 02/28/1970    Years since quitting: 53.8   Smokeless Tobacco Never

## 2023-12-13 ENCOUNTER — Encounter: Admit: 2023-12-13 | Discharge: 2023-12-13 | Payer: MEDICARE

## 2023-12-16 ENCOUNTER — Encounter: Admit: 2023-12-16 | Discharge: 2023-12-16 | Payer: MEDICARE

## 2023-12-16 DIAGNOSIS — E7849 Other hyperlipidemia: Secondary | ICD-10-CM

## 2023-12-16 DIAGNOSIS — I483 Typical atrial flutter: Secondary | ICD-10-CM

## 2023-12-16 DIAGNOSIS — R011 Cardiac murmur, unspecified: Secondary | ICD-10-CM

## 2023-12-16 DIAGNOSIS — R0989 Other specified symptoms and signs involving the circulatory and respiratory systems: Principal | ICD-10-CM

## 2023-12-16 DIAGNOSIS — I10 Essential (primary) hypertension: Secondary | ICD-10-CM

## 2023-12-16 LAB — BASIC METABOLIC PANEL
ANION GAP: 11
BLD UREA NITROGEN: 29 — ABNORMAL HIGH (ref 8.4–25.7)
CALCIUM: 9.1
CHLORIDE: 112 — ABNORMAL HIGH (ref 98–107)
CO2: 19 — ABNORMAL LOW (ref 23–31)
CREATININE: 0.9
GFR ESTIMATED: 85
GLUCOSE,PANEL: 113 — ABNORMAL HIGH (ref 70–105)
POTASSIUM: 4.3
SODIUM: 142

## 2023-12-16 LAB — LIPID PROFILE
CHOLESTEROL/HDL %: 3
CHOLESTEROL: 104
HDL: 36 — ABNORMAL LOW (ref >=40)
LDL: 49
TRIGLYCERIDES: 99
VLDL: 20

## 2024-01-10 ENCOUNTER — Encounter: Admit: 2024-01-10 | Discharge: 2024-01-10 | Payer: MEDICARE

## 2024-01-10 ENCOUNTER — Ambulatory Visit: Admit: 2024-01-10 | Discharge: 2024-01-10 | Payer: MEDICARE

## 2024-04-07 ENCOUNTER — Encounter: Admit: 2024-04-07 | Discharge: 2024-04-07 | Payer: MEDICARE
